# Patient Record
Sex: Female | Born: 1935 | Race: White | Hispanic: No | Smoking: Never smoker
Health system: Southern US, Community
[De-identification: ages and names within clinical notes are randomized; demographics above are authoritative.]

## PROBLEM LIST (undated history)

## (undated) DIAGNOSIS — I10 Essential (primary) hypertension: Secondary | ICD-10-CM

## (undated) DIAGNOSIS — M549 Dorsalgia, unspecified: Secondary | ICD-10-CM

## (undated) DIAGNOSIS — F329 Major depressive disorder, single episode, unspecified: Secondary | ICD-10-CM

## (undated) DIAGNOSIS — N200 Calculus of kidney: Secondary | ICD-10-CM

## (undated) DIAGNOSIS — F32A Depression, unspecified: Secondary | ICD-10-CM

## (undated) HISTORY — DX: Major depressive disorder, single episode, unspecified: F32.9

## (undated) HISTORY — DX: Depression, unspecified: F32.A

## (undated) HISTORY — DX: Calculus of kidney: N20.0

## (undated) HISTORY — DX: Essential (primary) hypertension: I10

## (undated) HISTORY — PX: KIDNEY STONE SURGERY: SHX686

## (undated) HISTORY — DX: Dorsalgia, unspecified: M54.9

## (undated) HISTORY — PX: ABDOMINAL HYSTERECTOMY: SHX81

---

## 1993-03-02 HISTORY — PX: BACK SURGERY: SHX140

## 2001-09-28 ENCOUNTER — Ambulatory Visit (HOSPITAL_COMMUNITY): Admission: RE | Admit: 2001-09-28 | Discharge: 2001-09-28 | Payer: Self-pay | Admitting: Internal Medicine

## 2001-09-28 ENCOUNTER — Encounter: Payer: Self-pay | Admitting: Internal Medicine

## 2001-09-30 ENCOUNTER — Ambulatory Visit (HOSPITAL_COMMUNITY): Admission: RE | Admit: 2001-09-30 | Discharge: 2001-09-30 | Payer: Self-pay | Admitting: Internal Medicine

## 2001-09-30 ENCOUNTER — Encounter: Payer: Self-pay | Admitting: Internal Medicine

## 2001-10-18 ENCOUNTER — Ambulatory Visit (HOSPITAL_COMMUNITY): Admission: RE | Admit: 2001-10-18 | Discharge: 2001-10-18 | Payer: Self-pay | Admitting: General Surgery

## 2003-01-01 ENCOUNTER — Emergency Department (HOSPITAL_COMMUNITY): Admission: EM | Admit: 2003-01-01 | Discharge: 2003-01-01 | Payer: Self-pay | Admitting: Emergency Medicine

## 2003-01-18 ENCOUNTER — Ambulatory Visit (HOSPITAL_COMMUNITY): Admission: RE | Admit: 2003-01-18 | Discharge: 2003-01-18 | Payer: Self-pay | Admitting: Family Medicine

## 2003-11-28 ENCOUNTER — Ambulatory Visit (HOSPITAL_COMMUNITY): Admission: RE | Admit: 2003-11-28 | Discharge: 2003-11-28 | Payer: Self-pay | Admitting: Unknown Physician Specialty

## 2005-06-16 ENCOUNTER — Ambulatory Visit (HOSPITAL_COMMUNITY): Admission: RE | Admit: 2005-06-16 | Discharge: 2005-06-16 | Payer: Self-pay | Admitting: Internal Medicine

## 2005-07-14 ENCOUNTER — Ambulatory Visit (HOSPITAL_COMMUNITY): Admission: RE | Admit: 2005-07-14 | Discharge: 2005-07-14 | Payer: Self-pay | Admitting: Internal Medicine

## 2007-05-24 ENCOUNTER — Emergency Department (HOSPITAL_COMMUNITY): Admission: EM | Admit: 2007-05-24 | Discharge: 2007-05-24 | Payer: Self-pay | Admitting: Emergency Medicine

## 2008-03-02 HISTORY — PX: CHOLECYSTECTOMY: SHX55

## 2008-10-12 ENCOUNTER — Ambulatory Visit (HOSPITAL_COMMUNITY): Admission: RE | Admit: 2008-10-12 | Discharge: 2008-10-12 | Payer: Self-pay | Admitting: Family Medicine

## 2008-10-19 ENCOUNTER — Inpatient Hospital Stay (HOSPITAL_COMMUNITY): Admission: EM | Admit: 2008-10-19 | Discharge: 2008-10-22 | Payer: Self-pay | Admitting: Emergency Medicine

## 2008-11-23 ENCOUNTER — Encounter (INDEPENDENT_AMBULATORY_CARE_PROVIDER_SITE_OTHER): Payer: Self-pay | Admitting: Surgery

## 2008-11-23 ENCOUNTER — Ambulatory Visit (HOSPITAL_COMMUNITY): Admission: RE | Admit: 2008-11-23 | Discharge: 2008-11-24 | Payer: Self-pay | Admitting: Surgery

## 2009-08-29 ENCOUNTER — Emergency Department (HOSPITAL_COMMUNITY): Admission: EM | Admit: 2009-08-29 | Discharge: 2009-08-29 | Payer: Self-pay | Admitting: Emergency Medicine

## 2009-09-24 ENCOUNTER — Ambulatory Visit (HOSPITAL_COMMUNITY): Admission: RE | Admit: 2009-09-24 | Discharge: 2009-09-24 | Payer: Self-pay | Admitting: Urology

## 2009-09-26 ENCOUNTER — Ambulatory Visit (HOSPITAL_COMMUNITY)
Admission: RE | Admit: 2009-09-26 | Discharge: 2009-09-26 | Payer: Self-pay | Source: Home / Self Care | Admitting: Urology

## 2009-11-13 ENCOUNTER — Ambulatory Visit (HOSPITAL_COMMUNITY): Admission: RE | Admit: 2009-11-13 | Discharge: 2009-11-13 | Payer: Self-pay | Admitting: Internal Medicine

## 2009-12-31 HISTORY — PX: COLONOSCOPY: SHX174

## 2009-12-31 HISTORY — PX: ESOPHAGOGASTRODUODENOSCOPY: SHX1529

## 2010-01-01 ENCOUNTER — Ambulatory Visit: Payer: Self-pay | Admitting: Internal Medicine

## 2010-01-01 DIAGNOSIS — R197 Diarrhea, unspecified: Secondary | ICD-10-CM

## 2010-01-02 ENCOUNTER — Encounter: Payer: Self-pay | Admitting: Internal Medicine

## 2010-01-04 ENCOUNTER — Encounter: Payer: Self-pay | Admitting: Gastroenterology

## 2010-01-07 ENCOUNTER — Encounter: Payer: Self-pay | Admitting: Gastroenterology

## 2010-01-08 ENCOUNTER — Encounter: Payer: Self-pay | Admitting: Gastroenterology

## 2010-01-08 LAB — CONVERTED CEMR LAB: TSH: 1.211 microintl units/mL (ref 0.350–4.500)

## 2010-01-09 ENCOUNTER — Encounter: Payer: Self-pay | Admitting: Internal Medicine

## 2010-01-10 ENCOUNTER — Encounter: Payer: Self-pay | Admitting: Internal Medicine

## 2010-01-17 ENCOUNTER — Telehealth (INDEPENDENT_AMBULATORY_CARE_PROVIDER_SITE_OTHER): Payer: Self-pay | Admitting: *Deleted

## 2010-01-27 ENCOUNTER — Telehealth (INDEPENDENT_AMBULATORY_CARE_PROVIDER_SITE_OTHER): Payer: Self-pay

## 2010-01-28 ENCOUNTER — Ambulatory Visit: Payer: Self-pay | Admitting: Internal Medicine

## 2010-01-28 ENCOUNTER — Ambulatory Visit (HOSPITAL_COMMUNITY): Admission: RE | Admit: 2010-01-28 | Discharge: 2010-01-28 | Payer: Self-pay | Admitting: Internal Medicine

## 2010-02-03 ENCOUNTER — Encounter (INDEPENDENT_AMBULATORY_CARE_PROVIDER_SITE_OTHER): Payer: Self-pay

## 2010-03-23 ENCOUNTER — Encounter: Payer: Self-pay | Admitting: Unknown Physician Specialty

## 2010-04-01 NOTE — Progress Notes (Signed)
Summary: Adding EGD to Colonoscopy  Phone Note Call from Patient Call back at Home Phone [phone]   Caller: Patient Call For: Gerrit Halls Reason for Call: Talk to Doctor Action Taken: Provider Notified Details of Complaint: Diffculty swallowing Summary of Call: Patient called and wanted to know if she could go ahead and have the upper endoscopy done since she is scheduled for a colonoscopy.She stated she is still having problems swallowing.Marland KitchenMarland KitchenMarland KitchenPlease advise? Initial call taken by: Ave Filter,  January 17, 2010 11:32 AM     Appended Document: Adding EGD to Colonoscopy that's fine. she had mentioned issues but had wanted to hold off. If she is having more problems, we can add.   Appended Document: Adding EGD to Colonoscopy Added EGD to procedure scheduled for 12/2909. I notified endo and the pt.

## 2010-04-01 NOTE — Progress Notes (Signed)
Summary: phone note/ ? med for back pain/ TCS tomorrow  Phone Note Call from Patient   Caller: Patient Summary of Call: Pt called and is having colonoscopy tomorrow. Has chronic back pain and doesn't have Tylenol at home.  Wants to know if it will be OK to take Ibuprofen or Aleve or ASA for the back pain today. Please advise!  Initial call taken by: Cloria Spring LPN,  January 27, 2010 11:11 AM     Appended Document: phone note/ ? med for back pain/ TCS tomorrow may take ibuprofen or aleve today. still needs to stick with the instructions on sheet regarding NPO after midnight, etc.   Appended Document: phone note/ ? med for back pain/ TCS tomorrow Addendum: Do not take ibuprofen or aleve. Take Tylenol products only. Thanks A  Appended Document: phone note/ ? med for back pain/ TCS tomorrow LMOM to call.  Appended Document: phone note/ ? med for back pain/ TCS tomorrow Pt informed of the above.

## 2010-04-01 NOTE — Letter (Signed)
Summary: OP REPORT  OP REPORT   Imported By: Rexene Alberts 01/10/2010 10:36:05  _____________________________________________________________________  External Attachment:    Type:   Image     Comment:   External Document

## 2010-04-01 NOTE — Letter (Signed)
Summary: REFERRAL FROM DR Phillips Odor  REFERRAL FROM DR Phillips Odor   Imported By: Rexene Alberts 01/02/2010 14:19:01  _____________________________________________________________________  External Attachment:    Type:   Image     Comment:   External Document

## 2010-04-01 NOTE — Letter (Signed)
Summary: LABS  LABS   Imported By: Rexene Alberts 01/02/2010 14:31:25  _____________________________________________________________________  External Attachment:    Type:   Image     Comment:   External Document

## 2010-04-01 NOTE — Letter (Signed)
Summary: AUTH FOR THE RELEASE OF MED INFO  AUTH FOR THE RELEASE OF MED INFO   Imported By: Rexene Alberts 01/07/2010 10:01:35  _____________________________________________________________________  External Attachment:    Type:   Image     Comment:   External Document

## 2010-04-01 NOTE — Assessment & Plan Note (Signed)
Summary: DIARRHEA/SS   Visit Type:  Initial Consult Referring Provider:  Phillips Odor Primary Care Provider:  Sherwood Gambler  CC:  diarrhea.  History of Present Illness: Jessica Bauer is a pleasant 77 year old female who presents today at the request of Dr. Phillips Odor due to chronic diarrhea. She states diarrhea began last year in 2010 when her gallbladder was removed. In May had bout of bronchitis and had several rounds of abx. Reports worsening of frequency, severity of diarrhea since that time.  Reports "pouring" water, difficult to make it to bathroom. Diarrhea seems to be worse in morning, but tapers off during day. In past occurred immediately after eating, now seems to occur regardless. Averages around 6 loose, watery stools per day; does have rare days that has a normal BM. Looks like mucus, no brbpr, hematochezia. Occasionally cramping prior to diarrhea, otherwise without abdominal pain. Denies nausea. Weight stable. No loss of appetite. Also reports +dysphagia over past few years, rare in occurrence. Feels like it may be related to anxiety. Last colonoscopy fall 2009 in [place], was reportedly incomplete secondary to low BP. No travel outside country, well water here, city water in [place], on abx currently for UTI. Recent studies done by Dr. Phillips Odor on 12/23/09: stool for O&P (-), stool culture (-), one C-diff (-), lactoferrin (-).   Current Medications (verified): 1)  Simvastatin 5 Mg Tabs (Simvastatin) .... Take 1 Tablet By Mouth Once A Day 2)  Atenolol 25 Mg Tabs (Atenolol) .... Take 1 Tablet By Mouth Once A Day 3)  Augmentin 875-125 Mg Tabs (Amoxicillin-Pot Clavulanate) .... Take 1 Tablet By Mouth Two Times A Day 4)  Lomotil 2.5-0.025 Mg Tabs (Diphenoxylate-Atropine) .... One Tablet Twice Daily 5)  Remeron 15 Mg Tabs (Mirtazapine) .... Take 1 Tablet By Mouth Once A Day 6)  Pentazocine-Acetaminophen 25-650 Mg Tabs (Pentazocine-Acetaminophen) .... As Needed 7)  Fosamax 70 Mg Tabs (Alendronate Sodium)  .... Once Weekly 8)  Joint Therapy .... One Tablet Three Times Daily 9)  Klor-Con 10 10 Meq Cr-Tabs (Potassium Chloride) .... Take 1 Tablet By Mouth Three Times A Day 10)  Glucosamine 500 Mg Caps (Glucosamine Sulfate) .... Three Capsules Daily 11)  Centrum Multi-Vitamin .... Take 1 Tablet By Mouth Once A Day  Allergies (verified): 1)  ! Bactrim 2)  ! Codeine  Past History:  Past Medical History: Hypertension Back pain Depression  Past Surgical History: Hysterectomy Back surgery 95 Cholecystectomy 2010 Kidney stone removal 2009, 2011  Family History: Mother:died at 54, CVA Father:died at 1, CVA Siblings:aneurysm No FH of Colon Cancer: Grandfather: some type of ca  Social History: Patient has never smoked.  Alcohol Use - no Daily Caffeine Use: coffee in mornings Patient gets regular exercise. [place] and [place]Smoking Status:  never Does Patient Exercise:  yes  Review of Systems General:  Denies fever, chills, and anorexia. Eyes:  Denies blurring, irritation, and discharge. ENT:  Denies earache, ear discharge, sore throat, and hoarseness. CV:  Denies chest pains, palpitations, and dyspnea on exertion. Resp:  Denies dyspnea at rest and dyspnea with exercise. GI:  Complains of abdominal pain, diarrhea, and change in bowel habits; denies difficulty swallowing, pain on swallowing, nausea, bloody BM's, and black BMs. GU:  Denies urinary burning and blood in urine. MS:  Denies joint pain / LOM, joint swelling, and joint stiffness. Derm:  Denies rash, itching, and dry skin. Neuro:  Denies weakness, paralysis, and headache. Psych:  Denies depression and anxiety.  Vital Signs:  Patient profile:   77 year  old female Height:      61 inches Weight:      126 pounds BMI:     23.89 Temp:     98.1 degrees F oral Pulse rate:   72 / minute BP sitting:   130 / 88  (left arm) Cuff size:   regular  Physical Exam  General:  Well developed, well nourished,  no acute distress. Head:  Normocephalic and atraumatic. Eyes:  no scleral icterus Mouth:  No deformity or lesions, dentition normal. Neck:  Supple; no masses or thyromegaly. Lungs:  Clear throughout to auscultation. Heart:  Regular rate and rhythm; no murmurs, rubs,  or bruits. Abdomen:  normal bowel sounds, without guarding, without rebound, no distension, no masses, and no hepatomegally or splenomegaly.   Msk:  Symmetrical with no gross deformities. Normal posture. Pulses:  Normal pulses noted. Extremities:  No clubbing, cyanosis, edema or deformities noted. Neurologic:  Alert and  oriented x4;  grossly normal neurologically. Skin:  Intact without significant lesions or rashes. Psych:  Alert and cooperative. Normal mood and affect.  Impression & Recommendations:  Problem # 1:  DIARRHEA (ICD-787.91) Jessica Bauer is a pleasant 77 year old female with chronic diarrhea for the past year, which she states started around the time after her cholecystectomy. Reports watery, occasionally post-prandial diarrhea. Intermittent days of normal BM, but usually diarrhea daily. Difficulty with incontinence/urgency. Occasional lower abdominal cramping relieved after stool. Has had several rounds of abx in the past year, most recently in May secondary to bronchitis; diarrhea worsened since this time. stool cultures negative, C diff X 1 negative. No other labs. Last colonoscopy in 2009 in [place]. No report available at this time. Diff Dx: C diff, IBS, bile salt diarrhea, microscopic colitis, doubt celiac disease  Obtain colonoscopy reports from [facility] in 2009 Obtain 2 more C-diff stool samples Giardia TSH Probiotic called into [facility] in [place] per pt request. Consider flex-sig/TCS if stool studies negative.   Orders: T-TSH [phone]) T-Stool Giardia / Crypto- EIA (71062) T-Culture, C-Diff Toxin A/B ([id number]) T-Culture, C-Diff Toxin A/B ([id number]) Consultation Level IV  (99371) Prescriptions: ALIGN  CAPS (PROBIOTIC PRODUCT) take 1 by mouth daily  #30 x 3   Entered and Authorized by:   Gerrit Halls NP   Signed by:   Gerrit Halls NP on 01/01/2010   Method used:   Faxed to ...       [facility] ([facility]       [address]       [place], [place]  [address]       Ph: [phone]       Fax: [phone]   RxID:   [id number]   Appended Document: DIARRHEA/SS 01/18/2008. Colonoscopy done in [place]: Grade I internal hemorrhoids   Appended Document: DIARRHEA/SS Please set up for TCS with Dr. Jena Gauss, change in bowel habits. Thanks!  Appended Document: DIARRHEA/SS Pt scheduled for tcs 01/28/10@10 :30am..Marland KitchenPt aware of appt. instructions faxed to [facility] in [place].

## 2010-04-01 NOTE — Miscellaneous (Signed)
Summary: stool studies from aph  Clinical Lists Changes Culture, Stool(Salm/Shig/Campy&ECO157) - STATUS: Final  SEE NOTE.                                 Perform Date: 29Nov11 11:34  Ordered By: Jena Gauss MD , Gerrit Friends           Ordered Date: 16XWR60 11:33  Facility: [facility]                               Department: [facility]  Service Report Text     SPECIMEN OBTAINED:            01/28/2010 11:34  SPECIMEN DESCRIPTION:         STOOL                                ENDO SPECIMEN  SPECIAL REQUESTS:             NONE  CULTURE:                      NO SALMONELLA, SHIGELLA, CAMPYLOBACTER, OR                                YERSINIA ISOLATED                                Performed at [facility]  REPORT STATUS:                FINAL                                02/01/2010  Additional Information  HL7 RESULT STATUS : F  External IF Update Timestamp : 2010-02-01:17:04:00.000000    Ova and Parasite Exam - STATUS: Final  SEE NOTE.                                 Perform Date: 29Nov11 11:34  Ordered By: Jena Gauss MD , Gerrit Friends           Ordered Date: [id number] 11:33  Facility: [facility]                               Department: [facility]  Service Report Text     SPECIMEN OBTAINED:            01/28/2010 11:34  SPECIMEN DESCRIPTION:         STOOL                                ENDO SPECIMEN  SPECIAL REQUESTS:             NONE  OVA AND PARASITES:            NO OVA OR PARASITES SEEN  REPORT STATUS:                FINAL  01/29/2010  Additional Information  HL7 RESULT STATUS : F  External IF Update Timestamp : 2010-01-29:15:58:00.000000    L-Clostridium Difficile by PCR - STATUS: Final                                            Perform Date: 29Nov11 11:34  Ordered By: Jena Gauss MD , Gerrit Friends           Ordered Date: 29Nov11 12:48                                       Last Updated Date: 16XWR60 11:29  Facility: [facility]                               Department: [facility]  Accession #:  [id number] CDPCR                  USN:       [id number]  Findings  Result Name                              Result     Abnl   Normal Range     Units      Perf. Loc.  C Difficile by PCR                       SEE NOTE.         NDETEC    Oversized comment, see footnote  1  Footnotes  1. Not Detected     (NOTE)     This assay detects the presence of Clostridium difficile DNA coding     for toxin B (tcdB) by real-time polymerase chain reaction (PCR)     amplification.     This test was developed and its performance characteristics have been     determined by [facility]. Performance characteristics refer     to the analytical performance of the test. This test has not been     cleared or approved by the [place] Food and Drug Administration. The FDA     has determined that such clearance or approval is not necessary. This     laboratory is certified under the Clinical Laboratory Improvement     Amendments of 1988 as qualified to perform high complexity clinical     laboratory testing.     Performed at [facility]  Additional Information  HL7 RESULT STATUS : F  External IF Update Timestamp : 2010-01-29:11:27:00.000000    Fecal-Lactoferrin - STATUS: Final  SEE NOTE.                                 Perform Date: 29Nov11 11:34  Ordered By: Jena Gauss MD , Gerrit Friends           Ordered Date: [id number] 11:34  Facility: [facility]                               Department: [facility]  Service Report Text     SPECIMEN OBTAINED:  01/28/2010 11:34  SPECIMEN DESCRIPTION:         STOOL                                ENDO SPECIMEN  SPECIAL REQUESTS:             NONE  FECAL LACTOFERRIN:            POSITIVE  REPORT STATUS:                FINAL                                01/29/2010  Additional Information  HL7 RESULT STATUS : F  External IF Update Timestamp : 2010-01-29:06:43:00.000000  Appended Document: stool studies from aph stool all negative except for pos  lactoferrin; should be on a probiotic - indefinitely -offer f/u appt if needed  Appended Document: stool studies from aph tried to call pt- LMOM  Appended Document: stool studies from aph pt aware

## 2010-04-01 NOTE — Letter (Signed)
Summary: TCS ORDER  TCS ORDER   Imported By: Ave Filter 01/09/2010 11:01:47  _____________________________________________________________________  External Attachment:    Type:   Image     Comment:   External Document

## 2010-05-13 LAB — CLOSTRIDIUM DIFFICILE BY PCR: Toxigenic C. Difficile by PCR: NOT DETECTED

## 2010-05-13 LAB — FECAL LACTOFERRIN, QUANT

## 2010-05-13 LAB — STOOL CULTURE

## 2010-05-13 LAB — OVA AND PARASITE EXAMINATION: Ova and parasites: NONE SEEN

## 2010-05-17 LAB — BASIC METABOLIC PANEL
CO2: 29 mEq/L (ref 19–32)
Calcium: 9 mg/dL (ref 8.4–10.5)
Glucose, Bld: 99 mg/dL (ref 70–99)
Sodium: 142 mEq/L (ref 135–145)

## 2010-05-17 LAB — CBC
HCT: 37.9 % (ref 36.0–46.0)
Hemoglobin: 13.2 g/dL (ref 12.0–15.0)
MCH: 34.7 pg — ABNORMAL HIGH (ref 26.0–34.0)
MCHC: 34.9 g/dL (ref 30.0–36.0)
MCV: 99.3 fL (ref 78.0–100.0)

## 2010-05-17 LAB — SURGICAL PCR SCREEN: Staphylococcus aureus: NEGATIVE

## 2010-05-18 LAB — DIFFERENTIAL
Basophils Relative: 0 % (ref 0–1)
Lymphs Abs: 1 10*3/uL (ref 0.7–4.0)
Monocytes Relative: 4 % (ref 3–12)
Neutro Abs: 8.6 10*3/uL — ABNORMAL HIGH (ref 1.7–7.7)
Neutrophils Relative %: 85 % — ABNORMAL HIGH (ref 43–77)

## 2010-05-18 LAB — URINALYSIS, ROUTINE W REFLEX MICROSCOPIC
Bilirubin Urine: NEGATIVE
Glucose, UA: NEGATIVE mg/dL
Specific Gravity, Urine: 1.025 (ref 1.005–1.030)
pH: 5.5 (ref 5.0–8.0)

## 2010-05-18 LAB — URINE MICROSCOPIC-ADD ON

## 2010-05-18 LAB — CBC
Hemoglobin: 13.4 g/dL (ref 12.0–15.0)
MCHC: 34 g/dL (ref 30.0–36.0)
Platelets: 193 10*3/uL (ref 150–400)
RBC: 3.89 MIL/uL (ref 3.87–5.11)

## 2010-05-18 LAB — BASIC METABOLIC PANEL
Calcium: 8.9 mg/dL (ref 8.4–10.5)
GFR calc Af Amer: >60 mL/min (ref >60)
GFR calc non Af Amer: 51 mL/min — ABNORMAL LOW (ref >60)
Sodium: 137 mEq/L (ref 135–145)

## 2010-06-06 LAB — DIFFERENTIAL
Basophils Absolute: 0 10*3/uL (ref 0.0–0.1)
Eosinophils Relative: 1 % (ref 0–5)
Lymphocytes Relative: 33 % (ref 12–46)
Lymphs Abs: 2.2 10*3/uL (ref 0.7–4.0)
Monocytes Absolute: 0.6 10*3/uL (ref 0.1–1.0)
Monocytes Relative: 9 % (ref 3–12)
Neutro Abs: 3.8 10*3/uL (ref 1.7–7.7)

## 2010-06-06 LAB — BASIC METABOLIC PANEL
GFR calc Af Amer: >60 mL/min (ref >60)
GFR calc non Af Amer: >60 mL/min (ref >60)
Glucose, Bld: 101 mg/dL — ABNORMAL HIGH (ref 70–99)
Potassium: 4.6 mEq/L (ref 3.5–5.1)
Sodium: 138 mEq/L (ref 135–145)

## 2010-06-06 LAB — CBC
HCT: 40.2 % (ref 36.0–46.0)
Hemoglobin: 13.5 g/dL (ref 12.0–15.0)
RBC: 4.05 MIL/uL (ref 3.87–5.11)
RDW: 13 % (ref 11.5–15.5)

## 2010-06-07 LAB — COMPREHENSIVE METABOLIC PANEL
ALT: 12 U/L (ref 0–35)
ALT: 13 U/L (ref 0–35)
AST: 15 U/L (ref 0–37)
Albumin: 2.8 g/dL — ABNORMAL LOW (ref 3.5–5.2)
Alkaline Phosphatase: 51 U/L (ref 39–117)
Alkaline Phosphatase: 51 U/L (ref 39–117)
CO2: 29 mEq/L (ref 19–32)
Calcium: 8.6 mg/dL (ref 8.4–10.5)
Chloride: 105 mEq/L (ref 96–112)
Chloride: 106 mEq/L (ref 96–112)
GFR calc Af Amer: >60 mL/min (ref >60)
GFR calc non Af Amer: >60 mL/min (ref >60)
Glucose, Bld: 83 mg/dL (ref 70–99)
Potassium: 4.3 mEq/L (ref 3.5–5.1)
Potassium: 4.9 mEq/L (ref 3.5–5.1)
Sodium: 139 mEq/L (ref 135–145)
Sodium: 139 mEq/L (ref 135–145)
Total Bilirubin: 0.3 mg/dL (ref 0.3–1.2)
Total Bilirubin: 0.4 mg/dL (ref 0.3–1.2)
Total Protein: 5.8 g/dL — ABNORMAL LOW (ref 6.0–8.3)

## 2010-06-07 LAB — DIFFERENTIAL
Basophils Absolute: 0 10*3/uL (ref 0.0–0.1)
Basophils Absolute: 0 10*3/uL (ref 0.0–0.1)
Basophils Absolute: 0 10*3/uL (ref 0.0–0.1)
Basophils Relative: 0 % (ref 0–1)
Basophils Relative: 0 % (ref 0–1)
Basophils Relative: 1 % (ref 0–1)
Basophils Relative: 1 % (ref 0–1)
Eosinophils Absolute: 0.1 10*3/uL (ref 0.0–0.7)
Eosinophils Absolute: 0.1 10*3/uL (ref 0.0–0.7)
Eosinophils Relative: 2 % (ref 0–5)
Lymphocytes Relative: 39 % (ref 12–46)
Lymphocytes Relative: 48 % — ABNORMAL HIGH (ref 12–46)
Lymphs Abs: 1.8 10*3/uL (ref 0.7–4.0)
Lymphs Abs: 2.6 10*3/uL (ref 0.7–4.0)
Monocytes Absolute: 0.5 10*3/uL (ref 0.1–1.0)
Monocytes Absolute: 0.6 10*3/uL (ref 0.1–1.0)
Monocytes Absolute: 0.6 10*3/uL (ref 0.1–1.0)
Monocytes Relative: 11 % (ref 3–12)
Monocytes Relative: 9 % (ref 3–12)
Neutro Abs: 2.3 10*3/uL (ref 1.7–7.7)
Neutro Abs: 2.7 10*3/uL (ref 1.7–7.7)
Neutro Abs: 2.8 10*3/uL (ref 1.7–7.7)
Neutrophils Relative %: 40 % — ABNORMAL LOW (ref 43–77)
Neutrophils Relative %: 48 % (ref 43–77)
Neutrophils Relative %: 51 % (ref 43–77)
Neutrophils Relative %: 52 % (ref 43–77)

## 2010-06-07 LAB — CBC
HCT: 37.7 % (ref 36.0–46.0)
HCT: 38.3 % (ref 36.0–46.0)
Hemoglobin: 12.9 g/dL (ref 12.0–15.0)
Hemoglobin: 13.4 g/dL (ref 12.0–15.0)
Hemoglobin: 13.4 g/dL (ref 12.0–15.0)
MCHC: 34.4 g/dL (ref 30.0–36.0)
MCHC: 34.8 g/dL (ref 30.0–36.0)
MCV: 99.5 fL (ref 78.0–100.0)
Platelets: 186 10*3/uL (ref 150–400)
Platelets: 187 10*3/uL (ref 150–400)
Platelets: 194 10*3/uL (ref 150–400)
Platelets: 197 10*3/uL (ref 150–400)
Platelets: 218 10*3/uL (ref 150–400)
RBC: 4.02 MIL/uL (ref 3.87–5.11)
RDW: 12.9 % (ref 11.5–15.5)
RDW: 12.9 % (ref 11.5–15.5)
RDW: 12.9 % (ref 11.5–15.5)
WBC: 5.2 10*3/uL (ref 4.0–10.5)
WBC: 6.6 10*3/uL (ref 4.0–10.5)
WBC: 6.7 10*3/uL (ref 4.0–10.5)

## 2010-06-07 LAB — HEPATIC FUNCTION PANEL
Bilirubin, Direct: 0.1 mg/dL (ref 0.0–0.3)
Indirect Bilirubin: 0.4 mg/dL (ref 0.3–0.9)
Total Protein: 6 g/dL (ref 6.0–8.3)

## 2010-06-07 LAB — BASIC METABOLIC PANEL
BUN: 12 mg/dL (ref 6–23)
BUN: 12 mg/dL (ref 6–23)
BUN: 14 mg/dL (ref 6–23)
CO2: 30 mEq/L (ref 19–32)
Calcium: 8.8 mg/dL (ref 8.4–10.5)
Calcium: 8.9 mg/dL (ref 8.4–10.5)
Chloride: 106 mEq/L (ref 96–112)
Creatinine, Ser: 0.73 mg/dL (ref 0.4–1.2)
Creatinine, Ser: 0.75 mg/dL (ref 0.4–1.2)
GFR calc Af Amer: >60 mL/min (ref >60)
GFR calc Af Amer: >60 mL/min (ref >60)
GFR calc non Af Amer: >60 mL/min (ref >60)
Glucose, Bld: 131 mg/dL — ABNORMAL HIGH (ref 70–99)
Potassium: 4.1 mEq/L (ref 3.5–5.1)

## 2010-06-07 LAB — LIPID PANEL
LDL Cholesterol: 92 mg/dL (ref 0–99)
Triglycerides: 69 mg/dL (ref <150)
VLDL: 14 mg/dL (ref 0–40)

## 2010-06-07 LAB — CARDIAC PANEL(CRET KIN+CKTOT+MB+TROPI)
CK, MB: 1.8 ng/mL (ref 0.3–4.0)
CK, MB: 2.1 ng/mL (ref 0.3–4.0)
Relative Index: INVALID (ref 0.0–2.5)
Total CK: 47 U/L (ref 7–177)
Total CK: 53 U/L (ref 7–177)
Troponin I: 0.03 ng/mL (ref 0.00–0.06)

## 2010-06-07 LAB — BRAIN NATRIURETIC PEPTIDE: Pro B Natriuretic peptide (BNP): <30 pg/mL (ref 0.0–100.0)

## 2010-06-07 LAB — URINE CULTURE
Colony Count: NO GROWTH
Culture: NO GROWTH

## 2010-06-07 LAB — URINALYSIS, ROUTINE W REFLEX MICROSCOPIC
Bilirubin Urine: NEGATIVE
Glucose, UA: NEGATIVE mg/dL
Hgb urine dipstick: NEGATIVE
Ketones, ur: NEGATIVE mg/dL
Specific Gravity, Urine: 1.01 (ref 1.005–1.030)
pH: 6 (ref 5.0–8.0)

## 2010-06-07 LAB — POCT CARDIAC MARKERS
CKMB, poc: <1 ng/mL — ABNORMAL LOW (ref 1.0–8.0)
Myoglobin, poc: 45 ng/mL (ref 12–200)
Troponin i, poc: <0.05 ng/mL (ref 0.00–0.09)

## 2010-06-07 LAB — D-DIMER, QUANTITATIVE: D-Dimer, Quant: 0.33 ug/mL-FEU (ref 0.00–0.48)

## 2010-06-07 LAB — TSH: TSH: 3.245 u[IU]/mL (ref 0.350–4.500)

## 2010-07-15 NOTE — Group Therapy Note (Signed)
NAME:  Jessica Bauer, KAUS             ACCOUNT NO.:  [id number]   MEDICAL RECORD NO.:  [id number]          PATIENT TYPE:  OBV   LOCATION:  A334                          FACILITY:  [facility]   PHYSICIAN:  Melissa L. Ladona Ridgel, MD  DATE OF BIRTH:  12-07-1925   DATE OF PROCEDURE:  10/21/2008  DATE OF DISCHARGE:                                 PROGRESS NOTE   Subjectively, again the patient feels well today.  She has had  completely no pain whatsoever.  We await evaluation in the a.m. for  possible discharge for surgical evaluation as an outpatient.  She denies  nausea or vomiting.  She is eating well.   VITAL SIGNS:  Temperature 98.3, blood pressure 114/70, pulse 59,  respirations 20, saturation 96%.  Generally a moderately nourished white female in no acute distress.  She is normocephalic, atraumatic.  Pupils appear to be slightly  irregular, but reactive.  Mucous membranes are moist.  NECK:  Supple.  There is no JVD.  No lymph nodes.  No carotid bruits.  Chest is decreased but clear to auscultation.  There are no rhonchi,  rales or wheezes.  CARDIOVASCULAR:  Regular rhythm.  Positive S1, S2.  No S3, S4.  No  murmurs, rubs or gallops.  ABDOMEN:  Soft, nontender, nondistended with positive bowel sounds.  No  hepatosplenomegaly, no guarding or rebound.  EXTREMITIES:  Show no clubbing, cyanosis, or edema.  Neurologically she is awake, alert, oriented.  Cranial nerves II-XII are  intact.  Power is 5/5.  DTRs 2+.  Plantars downgoing.   PERTINENT LABORATORIES:  Today reveal a sodium of 141, potassium 3.8,  chloride 108, CO2 30, BUN is 12, creatinine 0.7 and glucose of 131.  White count is 5.6, hemoglobin 12.9, hematocrit 37.5 and platelets of  186.  As stated the urine culture from the 19th is not growing anything  and I am not sure what the significance of that is.  We may be able to  get away with discontinuing therapy and not moving on to linezolid.  I  will check with infectious disease and see  what they think and in the  morning.   1. Urinary tract infection.  Will check with infectious disease and      decide whether to continue therapy or not.  2. Possible cholecystitis.  Will continue oral antibiotics as an      outpatient and coordinate care with surgery for possible outpatient      procedure.  3. Chest pain.  We will coordinate outpatient stress testing for      preoperative clearance at the time of discharge.  4. Anxiety.  The patient will continue with her Elavil and Xanax.  5. Hypertension.  Continue with her Tenormin.   Total time on this case was 20 minutes.      Melissa L. Ladona Ridgel, MD  Electronically Signed     MLT/MEDQ  D:  10/21/2008  T:  10/21/2008  Job:  [id number]

## 2010-07-15 NOTE — Group Therapy Note (Signed)
NAME:  Jessica Bauer, Jessica Bauer             ACCOUNT NO.:  [id number]   MEDICAL RECORD NO.:  [id number]          PATIENT TYPE:  OBV   LOCATION:  A334                          FACILITY:  [facility]   PHYSICIAN:  Melissa L. Ladona Ridgel, MD  DATE OF BIRTH:  Feb 15, 1926   DATE OF PROCEDURE:  10/20/2008  DATE OF DISCHARGE:                                 PROGRESS NOTE   SUBJECTIVE:  The patient is feeling much better.  She states that she  has had no pain in her abdomen today. She denies nausea or vomiting.  She has been up and ambulating, washing herself, and getting around.   PHYSICAL EXAMINATION:  VITAL SIGNS:  Current temperature was 98.3, blood  pressure 119/72, pulse 65, respirations 16, saturation 95%.  GENERAL:  This is a moderately nourished elderly female in no acute  distress.  HEENT:  She is normocephalic, atraumatic.  Pupils equal, round, and  reactive to light.  Extraocular muscles intact.  She has anicteric  sclerae.  Examination of nose reveals septum midline.  Examination of  mouth reveals reasonable dentition.  No oral lesions or lip lesions.  NECK:  Supple.  There is no JVD.  No lymph nodes.  No carotid bruits.  CHEST:  Decreased but clear to auscultation.  There are no rhonchi,  rales or wheezes.  CARDIOVASCULAR:  Regular rate and rhythm.  Positive S1, S2.  No S3, S4.  No murmurs, rubs or gallops.  ABDOMEN:  Soft, nontender, nondistended with positive bowel sounds.  No  hepatosplenomegaly, no guarding or rebound.  Extremities have been  EXTREMITIES:  Thin but reasonable. There is no edema and no lesions.  PSYCHIATRIC:  Affect is appropriate.  Recent and remote memory intact.  Judgment and insight intact.   PERTINENT LABORATORIES:  White count is 6.6, hemoglobin 13.4, hematocrit  38.3 and platelets of 197.  Her TSH was 3.245.  Her urine culture from  08/19 is actually showing no growth.   ASSESSMENT/PLAN:  This is a very pleasant 77 year old female that was  admitted to the hospital  after being found to have an methicillin-  resistant Staphylococcus aureus urinary tract infection in the  outpatient office.  The patient also had come to the hospital more  because she complained of chest pain.  Upon examining and evaluating her  complaints, it appears that she is actually having right upper quadrant  pain.  Ultrasound confirmed that there were stones and some thickening  in the gallbladder. Her antibiotic coverage was, therefore, broadened to  cover gallbladder. She has been on Cipro, Flagyl and vancomycin.  The  patient is doing much better.  She has had no further pains. She has a  little bit of pink in her cheeks, and she does definitely have improved  sense of being.   1. Methicillin-resistant Staphylococcus aureus urinary tract      infection.  Will continue vancomycin for now. I will decide in the      morning whether not we switch her to linezolid as it appears that      her last urine culture is not growing any MRSA.  2. Gallbladder.  Thickening with possible cholecystitis.  We are      treating her with broad-spectrum antibiotics.  The patient desires      to have her surgery as an outpatient and will likely need to be      cleared from a cardiac perspective prior to that because of the      chest pain. On Monday, we will coordinate care to assist with that      process.  3. Chest pain, resolved.  This may be all related to gallbladder, but      we do need to continue with an outpatient workup.   Total time on this patient was 20 minutes.      Melissa L. Ladona Ridgel, MD  Electronically Signed     MLT/MEDQ  D:  10/21/2008  T:  10/21/2008  Job:  [id number]

## 2010-07-15 NOTE — H&P (Signed)
NAME:  Jessica Bauer             ACCOUNT NO.:  [id number]   MEDICAL RECORD NO.:  [id number]          PATIENT TYPE:  OBV   LOCATION:  A334                          FACILITY:  [facility]   PHYSICIAN:  Melissa L. Ladona Ridgel, MD  DATE OF BIRTH:  May 12, 1925   DATE OF ADMISSION:  10/18/2008  DATE OF DISCHARGE:  LH                              HISTORY & PHYSICAL   CC: Left sided Chest  pain.   HISTORY OF PRESENT ILLNESS:  [name] is an 77 year old Caucasian  female who presents to the [facility] Emergency Department complaining  of left-sided chest pain that radiates slightly up her neck and down her  left arm.  At the time that she presents to [facility], she is pain  free.  She tells me that she has had this pain on and off for weeks, but  in the last two days it is gotten more severe, to the point where she is  unable to sleep at night.  It is worse when she lays down at night to go  to bed.  It does not seem to be associated with food.  It is noteworthy  that she had a CT of her abdomen and pelvis for a chronic urinary tract  infection just six days ago on August 13.  She had no obstructing  urinary tract calculus or nephrolithiasis.  She did have small hepatic  hypodense lesions.  Her gallbladder was mildly distended, and she had  calcified gallstones.   The patient tells me that she has had chronic back pain for years and  had surgery for this many years ago.  She has been on the etodolac  b.i.d. times multiple years.  She has not had any PPI therapy with this.  She does have occasional complaints of reflux and has also become  somewhat anorexic recently.  She describes no melena, hematochezia, or  vomiting.   She has had a persistent UTI that was treated with Septra for 10 days.  However, her hematuria persisted her primary care Bray Vickerman put her on  Levaquin.  She is now on her fourth day of Levaquin and wonders if the  Levaquin is causing her chest pain.   She tells me that she  had a colonoscopy one year ago in [place] that  was normal, without colonic polyps, and she has never had an upper  endoscopy.   PAST MEDICAL HISTORY:  Significant for hypertension,  hypercholesterolemia, kidney stone, depression, and anxiety.   PREVIOUS SURGERIES:  Include hysterectomy, back surgery, and kidney  stone removal.   CURRENT MEDICATIONS:  Include Levaquin 500 mg 1 tablet daily, started on  August 13, atenolol 25 mg once daily, Zocor 20 mg once daily, Xanax 0.25  mg b.i.d., amitriptyline 50 mg q.h.s., and etodolac 500 mg b.i.d.   ALLERGIES:  She has allergies to Bactrim and codeine.   REVIEW OF SYSTEMS:  CONSTITUTIONAL:  The patient has had no fever or  chills.  She has had weight loss over the last several years, centered  around the death of her husband.  She is somewhat anorexic now.  She is  not easily fatigued, and she complains of no rash, itch or changes.  HEENT:  She has an occasional stabbing headache in the left parietal  area.  She has no changes in vision.  No pain in her ears, nose, mouth,  or throat.  CHEST/LUNGS:  She has no pain with breathing.  She does have  a small nonproductive chronic cough.  She has no shortness of breath.  She does have occasional heart palpitations.  GASTROINTESTINAL:  She has  a decrease in her appetite.  No abdominal pain.  No change in her bowel  movement.  No melena, hematochezia, vomiting, or dysphagia.  GENITOURINARY:  She has no discharge or pain with urination.  She has  had blood in urine in her urine.  However, it has recently cleared  MUSCULOSKELETAL SYSTEM:  She has no joint pain, stiffness, swelling, or  redness in her joints.  NEUROLOGICAL: She has no problems with balance,  syncope, or seizures.  PSYCHIATRIC:  She has had some problems in the  past with depression, mostly related to the death of her husband.  She  complains of no mood changes or suicidal thoughts.   SOCIAL HISTORY:  She is a Optician, dispensing.  Does not  smoke tobacco, drink  alcohol, or use any recreational drugs.   FAMILY HISTORY:  Positive for hypertension and stroke.  Negative for  known gallbladder disease or coronary artery disease.   PHYSICAL EXAMINATION:  VITAL SIGNS:  On physical exam her vital signs  are as follows.  Temperature 98.3, pulse 71, respirations 20.  Blood  pressure is 141/74.  GENERAL APPEARANCE:  The patient is a very pleasant 77 year old thin  Caucasian female who is lying comfortably in the ED department in no  apparent distress.  HEENT:  Her head is normocephalic, atraumatic.  Eyes are somewhat  sunken.  However, they are anicteric with pupils that are equal, round,  and reactive to light.  Her conjunctivae are pink.  Her nose shows no  nasal discharge or exterior lesions.  Her mouth shows no exterior  lesions.  She has moist mucous membranes and good dentition.  NECK:  Her neck shows no obvious JVD or lymphadenopathy.  Trachea is  midline.  Neck is supple.  RESPIRATORY:  Chest is thin.  She has no accessory muscle use.  No  wheezes or crackles to my auscultation.  CARDIOVASCULAR:  Heart has a regular rate and rhythm with no obvious  murmurs, rubs, or gallops.  ABDOMEN:  Soft, nontender, nondistended with good bowel sounds.  RECTAL:  On rectal exam, she is guaiac negative.  EXTREMITIES:  Her lower extremities show no edema.  They are slightly  cool to the touch but show no cyanosis.  She has 1+ dorsalis pedis  pulses bilaterally.  NEURO:  She is fully alert and oriented.  Has 5/5 strength in all four  extremities.  There is no evidence of any facial asymmetry.  Reflexes  are 2+ bilaterally.   LABS:  A white count of 5.2, hemoglobin 13.8, hematocrit 40, platelets  218,000.  D dimer is 0.33.  Sodium is 141.  Potassium is 4.5.  Chloride  106, bicarb 30, glucose 96, BUN 14, creatinine 0.73, total bilirubin  0.5, alk phos 59, AST 16, ALT 12, total protein 6.0, serum albumin 3.1.  Cardiac enzymes are negative  times one.  Urine shows 3 to 6 white blood  cells in each high-powered field.  She has a small amount of leukocytes.  RADIOLOGICAL EXAMS:  Include a chest x-ray from today that shows COPD  with no acute abnormalities.  The CT abdomen and pelvis done August 13  showed a mildly distended gallbladder with calcified gallstones as  mentioned above.  It is also noteworthy that the patient had an  echocardiogram in 2007 for a mitral valve click.  She had mild left  atrial enlargement, mild LVH, mild mitral and tricuspid regurgitation,  marked right atrial enlargement, moderate right ventricular enlargement  with preserved right systolic function.   ASSESSMENT:  Dr. Derenda Mis has seen and examined the patient,  collected her history, and reviewed her chart, and her impression is  this is an 77 year old Caucasian female who comes to the emergency  department with chest pain that has been becoming progressively severe  over the last couple of weeks.  She also has a persistent urinary tract  infection, hypertension, chronic obstructive pulmonary disease, and  anxiety.  We will admit her for observation, rule out cardiac sources  for her chest pain.  Also, we will consider gastrointestinal sources for  her chest pain.  We will start her on antibiotic therapy for her urinary  tract infection and a proton pump inhibitor for possible peptic ulcer  disease or gastritis.  We will discontinue nonsteroidal anti-  inflammatory drug use.  More recommendations to follow as we are further  along in her evaluation.   Addendum: I had seen and examined its patient. I have reviewed the past  medical history, a surgical history, social history and family history,  as well as review system, allergies and medications. I've participated  in developing a plan of care for this patient with the physician's  assistant and agree with the above.   Melissa L. Ladona Ridgel, M.D.      Stephani Police, PA       Melissa L. Ladona Ridgel, MD  Electronically Signed    MLY/MEDQ  D:  10/18/2008  T:  10/18/2008  Job:  [id number]   cc:   [facility]

## 2010-07-15 NOTE — Discharge Summary (Signed)
NAME:  Jessica Bauer, Jessica Bauer             ACCOUNT NO.:  [id number]   MEDICAL RECORD NO.:  [id number]          PATIENT TYPE:  OBV   LOCATION:  A334                          FACILITY:  [facility]   PHYSICIAN:  Melissa L. Ladona Ridgel, MD  DATE OF BIRTH:  01/31/1926   DATE OF ADMISSION:  10/18/2008  DATE OF DISCHARGE:  08/23/2010LH                               DISCHARGE SUMMARY   DISCHARGE DIAGNOSES INCLUDE:  1. Noncardiac chest pain - presumed gallbladder disease.  2. Urinary tract infection - Methicillin-resistant Staphylococcus      aureus.  3. Hypertension.   CONSULTANTS:  Dr. Mariah Milling from [facility] saw the  patient on August 19.  His impression was that this is an 77 year old  female who had three sets of negative cardiac enzymes.  It is very  likely her chest pain was not cardiac related.  He suggested that an  outpatient cardiac stress test could be performed.  If this was  negative, her symptoms could be managed medically, and if it was  positive, they would need with her in the office and go over the results  with her.   PERTINENT RADIOLOGICAL EXAMS:  [name] had an ultrasound of  her abdomen done on August 20 with the following results:  Cholelithiasis with gallbladder wall thickening, cannot exclude early  acute cholecystitis; extensive atherosclerotic disease of the abdominal  aorta; mild pancreatic ductal prominence without definite pancreatic  mass or calcification.  Her common bile duct was 6 mm in diameter.   HISTORY AND BRIEF HOSPITAL COURSE:  [name] is an 77-year-  old female originally from [place] down here for a short-term stay who  presented to the [facility] ED with chest pain on August 19th.  She also  was being treated for a MRSA UTI as an outpatient.  With regards to the  chest pain, she described the sensation as being over her left breast.  It radiated up her neck and down her left arm at times.  She had had the  sensation  on and off for weeks.  However, it had become more intense  just prior to her presentation at the hospital.  She was unable to sleep  the night before.  She noticed that the pain usually became acute when  she laid down to go to sleep at night.  It was not necessarily  associated with food.  She had no dyspnea on exertion.  She was seen by  Dr. Mariah Milling for the chest pain.  His results are noted above.  An  ultrasound of her upper abdomen was done.  The results are noted above.  She was placed on Flagyl for empiric treatment of early cholecystitis.  It was also noted that the patient had been taking etodolac for years  for back pain and felt that she could possibly have NSAID-induced  gastritis.  She was taken off her etodolac.  Protonix daily was started.  She was given Ultram for her pain.  She did not want to have further  evaluation of her gallbladder in [place] and would prefer it to be  done in [place].  Consequently, an outpatient office visit was  scheduled with Dr. Corliss Skains for potential gallbladder surgery.  Also, an  outpatient cardiac stress test is scheduled in Dr. Windell Hummingbird office.  If  this is negative, he will  evaluate her for cardiac clearance for  gallbladder surgery.   With regards to her UTI, approximately two weeks ago the patient was  diagnosed with a UTI because she was having hematuria.  She was placed  on Septra for 10 days.  At the end of this prescription treatment, she  was still having hematuria, returned to her primary care Ryonna Cimini who  then placed her on Levaquin as she had had a urine culture that showed  MRSA sensitive to Levaquin.  When she presented to the [facility] ED for  chest pain, she had been on the Levaquin for four days.  As an inpatient  she received three days of IV vancomycin.  Cultures were taken when she  presented to the [facility] ED, and these urine cultures did not grow  MRSA or any other bacteria.  This morning prior to discharge I  have  spoken with Dr. Orvan Falconer with regards to the need for antibiotic  treatment for MRSA UTI.  Given the antibiotic treatment that she has had  to date as well as the fact that urine taken at the [facility] ED prior  to giving her IV vancomycin did not grow anything, Dr. Orvan Falconer  recommended that we consider the UTI treated and not continue outpatient  antibiotic therapy for UTI.  Note, the patient has not had hematuria for  approximately seven days.   PHYSICAL EXAMINATION:  VITAL SIGNS:  On physical exam today, the  patient's vital signs are as follows:  Temperature 98.1, pulse 61,  respirations 20.  Blood pressure is 125/62.  GENERAL APPEARANCE:  She is alert, oriented in no apparent distress,  lying comfortably in her hospital bed.  HEENT:  Her head is atraumatic, normocephalic.  Her eyes are anicteric  with pupils that are equal, round, and reactive to light.  Conjunctivae  are pink.  She has no nasal discharge.  She has no exterior lesions on  her nose or mouth.  Her mucous membranes are moist.  NECK:  Her neck is supple.  Trachea midline.  She demonstrates no JVD or  lymphadenopathy.  CHEST:  Her chest has no accessory muscle use.  RESPIRATORY:  Her lungs clear to auscultation bilaterally with no  wheezes or crackles.  CARDIOVASCULAR:  Her heart has regular rate and rhythm with no murmurs,  rubs, or gallops.  Normal S1, S2.  EXTREMITIES:  Her extremities show no cyanosis or edema.  She has 2+  radial pulses bilaterally and 2+ dorsalis pedis pulses bilaterally.  ABDOMEN:  Her abdomen is soft, nontender, nondistended with good bowel  sounds.  PSYCHIATRICALLY:  Both her short-term and long-term memory are intact.  She is in good humor, does not appear anxious today.  Looking forward to  going home and packing up her house to go back to [place].  She has no  signs of depression or suicidal ideation.   LABS:  Labs today show a CBC with a white count of 6.7, hemoglobin 12.9,   hematocrit 37.7, platelets 187,000.  On August 21 her LFTs were as  follows:  Total bilirubin 0.3, alkaline phosphatase 51, AST 17, ALT 13.  Serum albumin 2.8.  Bmet today:  Sodium 139, potassium 4.1, chloride  105, bicarb 29, glucose 87,  BUN 12, creatinine 0.77.  Cardiac markers  taken on August 19 and August 20 were negative x3.  Fasting lipid  studies show a cholesterol of 151, triglycerides of 69, high-density  cholesterol 45, low-density cholesterol 92, very low density 14.  Thyroid studies show a TSH of 3.245.   DISPOSITION:  The patient will be discharged to home in good condition.   FOLLOW-UP INCLUDES:  1. An appointment at [facility] with Dr. Corliss Skains on August      26 at 10:45 a.m. to plan for potential gallbladder surgery.  2. She will have an outpatient cardiac stress test that [facility] on August 30 at 8:15 a.m.  3. Finally she has an office visit scheduled with Dr. Mariah Milling at 9:45      on September 9 to review her stress test results and grant cardiac      clearance hopefully after she is seen by Dr. Corliss Skains  and has her      cardiac stress test.  Her appointment with Dr. Mariah Milling or      [facility] can be moved up to allow her      surgery to happen earlier as she wants to go back to [place]      a[place]s.a.p[place]   DISCHARGE MEDICATIONS:  Discharge medications are as follows:  1. Xanax 0.25 one tablet twice a day as needed for anxiety.  2. Elavil 25 mg one tablet at bedtime.  3. Atenolol 25 mg one tablet daily.  4. Zocor 20 mg one tablet daily.  5. Tramadol 50 mg one tablet every 4 hours as needed for pain.  6. Protonix one daily for 1 month.  7. Flagyl 500 mg twice daily for seven days for treatment of early      cholecystitis   Approximately 50 minutes was spent on this discharge.   Addendum: Please note that I saw and examined this patient prior to  discharge.  I agree with the above discharge diagnoses. I would add  the  following  discharge diagnoses: anxiety, able. tant and agree withs case  with a physician's assistant and agree with the above discharge plan.   Melissa L. Ladona Ridgel, M.D.      Stephani Police, PA      Melissa L. Ladona Ridgel, MD  Electronically Signed    MLY/MEDQ  D:  10/22/2008  T:  10/22/2008  Job:  [id number]   cc:   Wilmon Arms. Corliss Skains, M.D.  [address] [place] [address]  [place]   Antonieta Iba, MD  Fax: [phone]

## 2010-07-15 NOTE — Discharge Summary (Signed)
NAME:  Jessica Bauer, Jessica Bauer             ACCOUNT NO.:  [id number]   MEDICAL RECORD NO.:  [id number]          PATIENT TYPE:  OBV   LOCATION:  A334                          FACILITY:  [facility]   PHYSICIAN:  Melissa L. Ladona Ridgel, MD  DATE OF BIRTH:  October 10, 1925   DATE OF ADMISSION:  10/18/2008  DATE OF DISCHARGE:                               DISCHARGE SUMMARY   ADDENDUM:  The addendum is as follows:  1. Please add to discharge medications Cipro 500 mg 1 tablet p.o.      b.i.d. for 14 days.  2. Please add under cc's cc to Melony Overly, physician extender, at      [facility].      Stephani Police, [place]      Melissa L. Ladona Ridgel, MD  Electronically Signed    MLY/MEDQ  D:  10/22/2008  T:  10/22/2008  Job:  [id number]   cc:   Madelin Rear. Sherwood Gambler, MD  Fax: [phone]

## 2010-07-15 NOTE — Consult Note (Signed)
NAME:  Jessica, Bauer NO.:  [id number]   MEDICAL RECORD NO.:  [id number]          PATIENT TYPE:  OBV   LOCATION:  A334                          FACILITY:  [facility]   PHYSICIAN:  Antonieta Iba, MD   DATE OF BIRTH:  Nov 26, 1925   DATE OF CONSULTATION:  10/19/2008  DATE OF DISCHARGE:                                 CONSULTATION   IDENTIFICATION:  Jessica Bauer is a very pleasant 77 year old woman who  looks younger than her stated age, who has a past medical history of  hypertension and hyperlipidemia, kidney stones, depression and anxiety,  who presented to [facility] on October 18, 2008, with chest  pain.   Jessica Bauer states that she developed chest pain, radiating over the  left breast, up into the neck and into the shoulders.  She has had short  episodes of this discomfort before though it was more severe and she was  unable to sleep that night prior to presentation.  She states that she  has had it on and off for several weeks.  No significant association  with food or with exertion and it seemed to come on sometimes at rest.  It seemed to resolve one its own when she arrived to the hospital, and  she has not really had any significant chest pain since that time.   She has had negative cardiac enzymes to date, and has been on  antibiotics during her inpatient stay.   REVIEW OF SYSTEMS:  Review of systems is notable for very for chest pain  on the left side as detailed above, some stress and anxiety.  No  lightheadedness, dizziness, no significant shortness of breath or edema.  No significant weight loss recently, otherwise review of systems is  negative.   PAST MEDICAL HISTORY:  1. Past medical history is notable for hypertension.  2. Hyperlipidemia.  3. Kidney stones.  4. Depression.  5. Anxiety.  6. Gallstones, as documented recently on a CT scan.   SOCIAL HISTORY:  She has no smoking history.  Her husband was a  Optician, dispensing.  She lives alone  and does not drink and no drugs.   FAMILY HISTORY:  Notable for stroke and hypertension.  Not significant  for premature coronary disease.   PHYSICAL EXAMINATION:  GENERAL:  She is sitting comfortably in bed and  again appears younger than her stated age.  VITAL SIGNS:  She is afebrile, heart rate in the 60s, blood pressure 120  to 130 systolic over 60, with respirations of 18 to 20, oxygen  saturation 97% on room air.  HEENT:  Exam is benign.  Oropharynx is clear.  NECK:  Supple with no JVP or carotid bruits.  HEART:  Heart sounds are regular with S1 and S2, and a 1 to 2/6 systolic  ejection murmur heard at the right sternal border, radiating to the left  sternal border.  LUNGS:  Clear to auscultation with no wheezes or rales.  ABDOMEN:  Benign.  Has no significant lower extremity edema.  NEUROLOGIC:  Neurologic exam is grossly nonfocal.  SKIN:  Skin is warm and  dry.   LABORATORY DATA:  Cardiac enzymes negative x3, with one more set  pending.  CMP is within normal limits with a creatinine of 0.67, BMP  less than 30, a UA showing no growth and urine culture pending, 3 to 6  white blood cell count in the urine.  D-dimer also negative at 0.33.   EKG showing normal sinus rhythm with poor R-wave progression anteriorly,  with no other significant ST-T wave changes.  Borderline left axis  deviation.   IMPRESSION:  In summary, Jessica Bauer is an 77 year old woman who is  typically very active, with a history of some anxiety, gallstones and  kidney stones, with recent episodes of chest pain over the past several  weeks, worse prior to admission.  She has had no significant chest pain  since she has been in the hospital, no electrocardiogram changes and  enzymes have been negative.  She has few risk factors, apart from her  age and a family history of stroke (though no cardiac history).   RECOMMENDATIONS:  We have suggested to Jessica Bauer that if her  third/fourth set of cardiac enzymes  are negative, she could go home from  a cardiac perspective, and that we could perform an outpatient stress  test.  We will contact her as soon as she gets home.  (Will contact her  on Monday), and schedule a pharmacologic stress test.  If this is  negative, then her symptoms can be managed medically.  If her stress  test is positive, we will meet with her in the office to go over the  results with her.   Thank you for this consult.  Please do not hesitate to contact me if I  can be of further assistance with the care of Jessica Bauer.      Antonieta Iba, MD  Electronically Signed     TJG/MEDQ  D:  10/19/2008  T:  10/19/2008  Job:  [id number]

## 2010-07-18 NOTE — H&P (Signed)
   NAME:  Jessica Bauer, Jessica Bauer NO.:  [id number]   MEDICAL RECORD NO.:  [id number]                   PATIENT TYPE:   LOCATION:                                       FACILITY:   PHYSICIAN:  Dalia Heading, M.D.               DATE OF BIRTH:  1925-06-05   DATE OF ADMISSION:  DATE OF DISCHARGE:                                HISTORY & PHYSICAL   CHIEF COMPLAINT:  Change in caliber of stools, hematochezia.   HISTORY OF PRESENT ILLNESS:  The patient is a 77 year old white female who  is referred for a colonoscopy.  She had an episode of blood per rectum  several weeks ago.  She has also noticed a decrease in the caliber of her  stools recently.  She does have occasional lower abdominal pain.  She also  describes some nonspecific rectal pain.  She denies any lightheadedness,  weight loss, fever, diarrhea, or melena.  She has never had a colonoscopy.  She denies any hemorrhoidal problems.  There is no immediate family history  of colon carcinoma.   PAST MEDICAL HISTORY:  Past medical history include hypertension, anxiety  disorder.   PAST SURGICAL HISTORY:  Hysterectomy, back surgery, cyst removal from the  urethra.   CURRENT MEDICATIONS:  Xanax, Premarin, amitriptyline, possibly Actonel.   ALLERGIES:  BACTRIM and CODEINE.   REVIEW OF SYSTEMS:  Unremarkable.   PHYSICAL EXAMINATION:  GENERAL:  On physical examination, the patient is a  well-developed, well-nourished white female in no acute distress.  VITAL SIGNS:  She is afebrile and vital signs are stable.  LUNGS:  Lungs are clear to auscultation with equal breath sounds  bilaterally.  HEART:  Examination reveals a regular rate and rhythm without S3, S4, or  murmurs.  ABDOMEN:  The abdomen is soft, nontender, and nondistended.  No  hepatosplenomegaly or masses are noted.  RECTAL:  Examination was deferred to the procedure.   IMPRESSION:  Change in caliber of stools, hematochezia, lower abdominal  pain.    PLAN:  The patient is scheduled for a colonoscopy on October 18, 2001.  The  risks and benefits of the procedure including bleeding and perforation were  fully explained to the patient, who gave informed consent.                                               Dalia Heading, M.D.    MAJ/MEDQ  D:  10/11/2001  T:  10/11/2001  Job:  [id number]   cc:   Madelin Rear. Sherwood Gambler, M.D.  [id number]  [place]  [place] [address]  Fax: [phone]

## 2010-07-18 NOTE — Procedures (Signed)
NAME:  Jessica Bauer, Jessica Bauer             ACCOUNT NO.:  [id number]   MEDICAL RECORD NO.:  [id number]          PATIENT TYPE:  OUT   LOCATION:  RAD                           FACILITY:  [facility]   PHYSICIAN:  Dani Gobble, MD       DATE OF BIRTH:  03-28-1925   DATE OF PROCEDURE:  06/17/2005  DATE OF DISCHARGE:                                  ECHOCARDIOGRAM   INDICATIONS:  77 year old female with the mitral valve click.   The technical quality of the study is adequate.  The aorta measures normal  at 3.1 cm.  The left atrium is mildly dilated.  The intraventricular septum  and posterior wall are mildly thickened.  The aortic valve is trileaflet and  pliable with normal leaflet excursion.  No significant aortic insufficiency  is noted.  Doppler interrogation showed aortic valve is within normal  limits.   The mitral valve appears grossly structurally normal.  No mitral valve  prolapse is noted.  Mild mitral regurgitation is noted.  Doppler  interrogation of the mitral valve is within normal limits.   The pulmonic valve is incompletely visualized but appeared be grossly  structurally normal.   Tricuspid valve also appears grossly structurally normal with mild tricuspid  regurgitation noted.   The left ventricle is a somewhat small cavity size but the LV IDD measured 3  cm and LV IC measured 2 cm.  Overall left ventricular  systolic function is  normal and no regional wall motion abnormalities are noted.  The presence of  diastolic dysfunction is inferred from pulse wave Doppler across mitral  valve.   The right ventricle is moderately dilated with normal right systolic  function.  The right atrium is markedly dilated.   IMPRESSION:  1.  Mild left atrial enlargement.  2.  Mild concentric LVH.  3.  Mild mitral and tricuspid regurgitation.  4.  Small left ventricular cavity size with normal left systolic function.      No regional wall motion abnormalities noted.  5.  Presence of diastolic  dysfunction is inferred from pulse wave Doppler      across the mitral valve.  6.  Moderate right ventricular enlargement with preserved right systolic      function.  7.  Marked right atrial enlargement.           ______________________________  Dani Gobble, MD     AB/MEDQ  D:  06/17/2005  T:  06/17/2005  Job:  [id number]   cc:   Madelin Rear. Sherwood Gambler, MD  Fax: [phone]

## 2010-09-24 IMAGING — CT CT ABDOMEN W/O CM
2 of 4 series · 16 of 46 positions shown, 18 images · non-contrast
Comparison: None.

CT ABDOMEN

CLINICAL DATA: Hematuria, history of renal calculi, urinary tract
infection

CT ABDOMEN AND PELVIS WITHOUT CONTRAST
TECHNIQUE: Multidetector CT imaging of the abdomen and pelvis was
performed following the standard protocol without intravenous
contrast.

[Series 2: standard/full over (age)lbs 5.0 · axial · 0.66mm/px · z∈[-426,-111]mm · 13 of 70 slices shown, 15 images]
[im 4/70  soft-tissue]
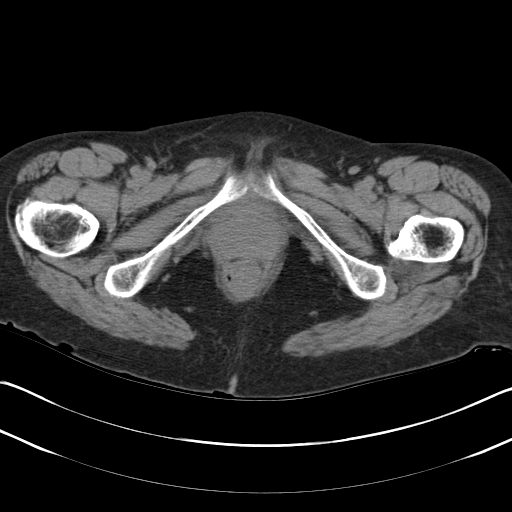
[im 4/70  bone]
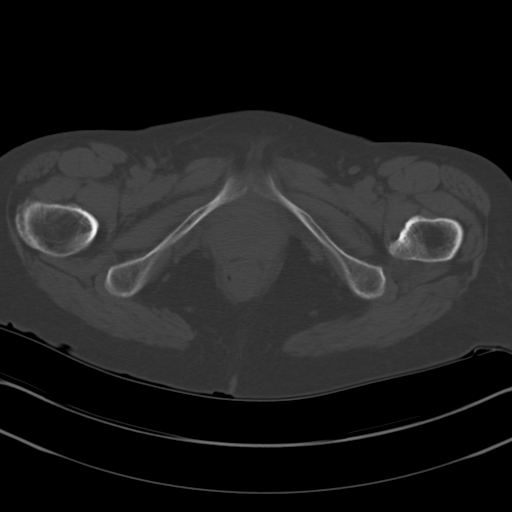
[im 10/70  soft-tissue]
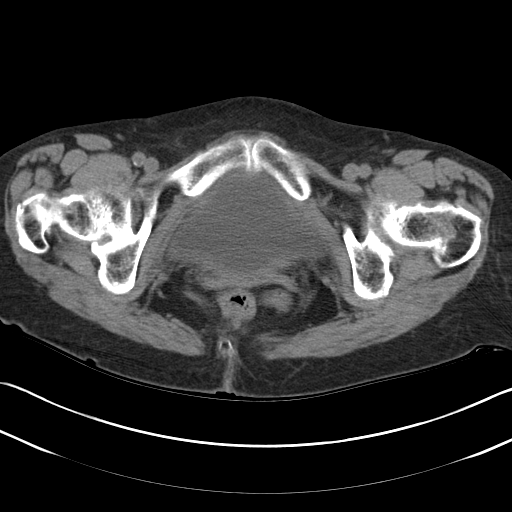
[im 16/70  soft-tissue]
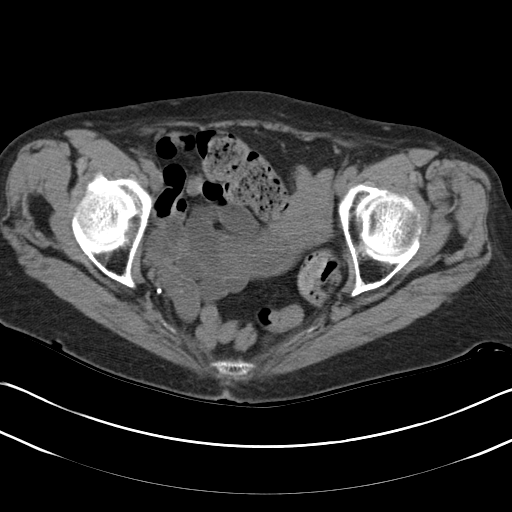
[im 19/70  soft-tissue]
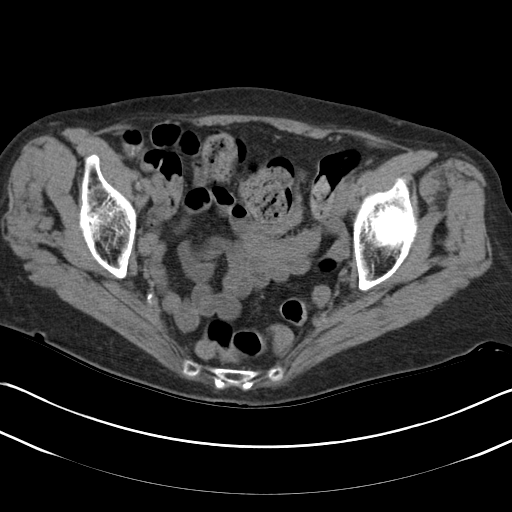
[im 25/70  soft-tissue]
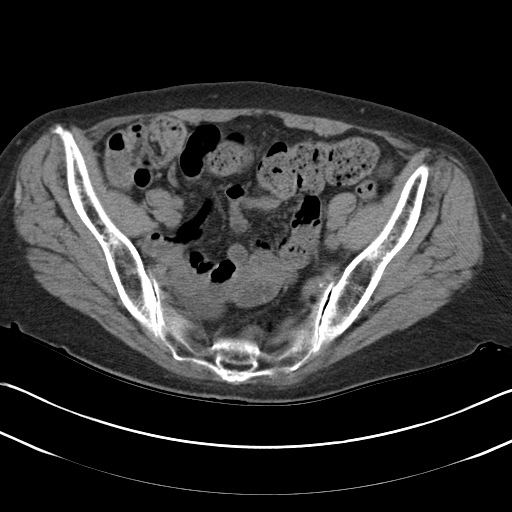
[im 31/70  soft-tissue]
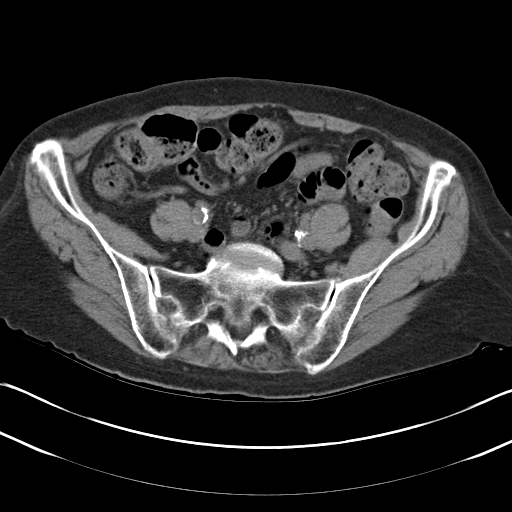
[im 37/70  soft-tissue]
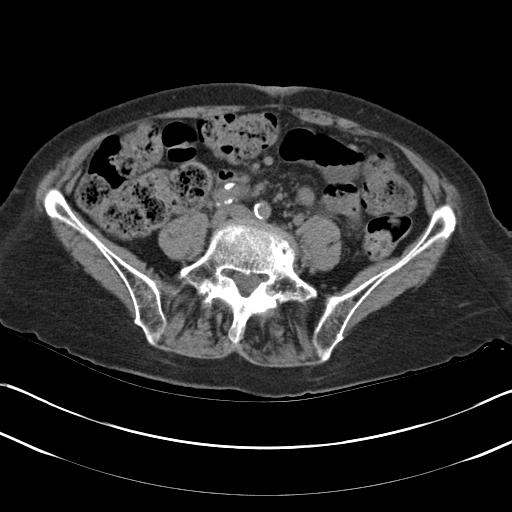
[im 40/70  soft-tissue]
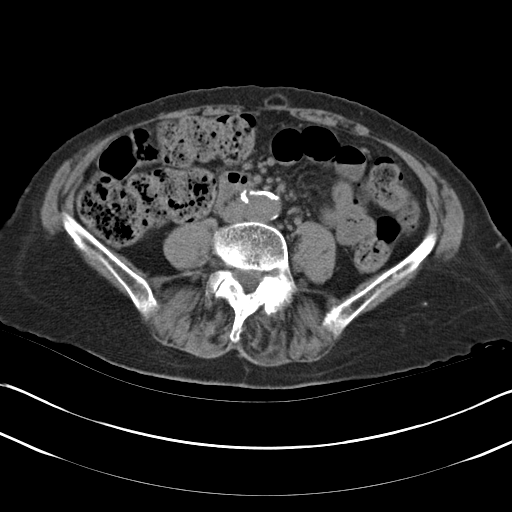
[im 46/70  soft-tissue]
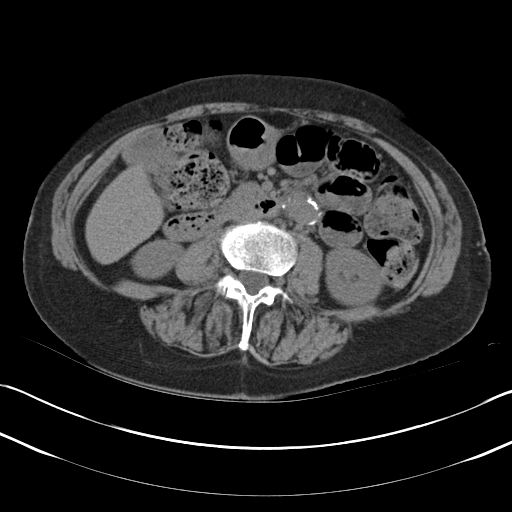
[im 46/70  bone]
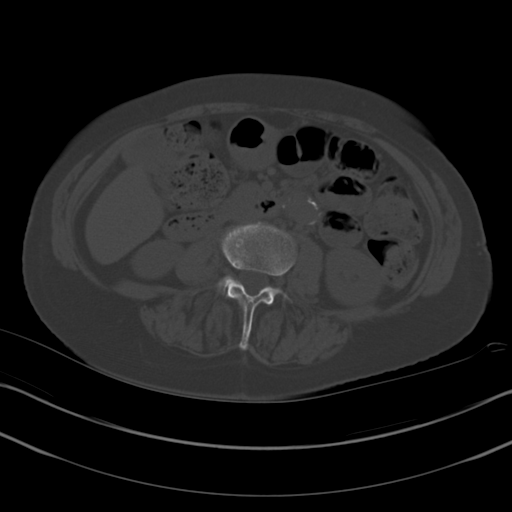
[im 52/70  soft-tissue]
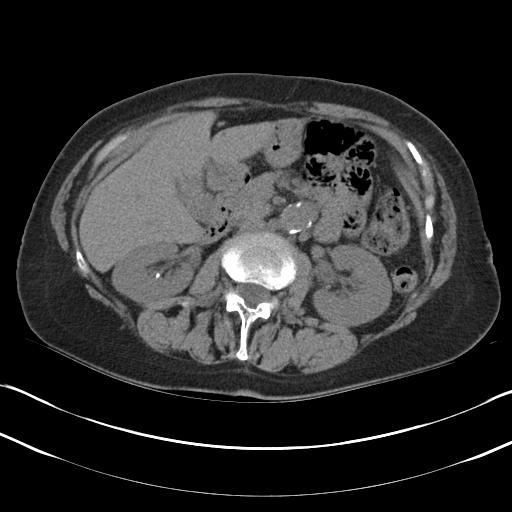
[im 55/70  soft-tissue]
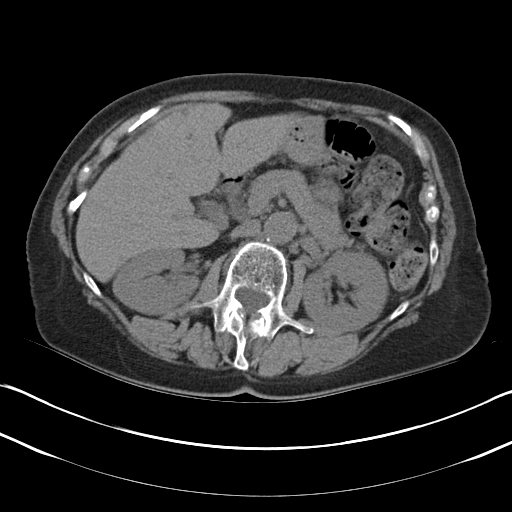
[im 61/70  soft-tissue]
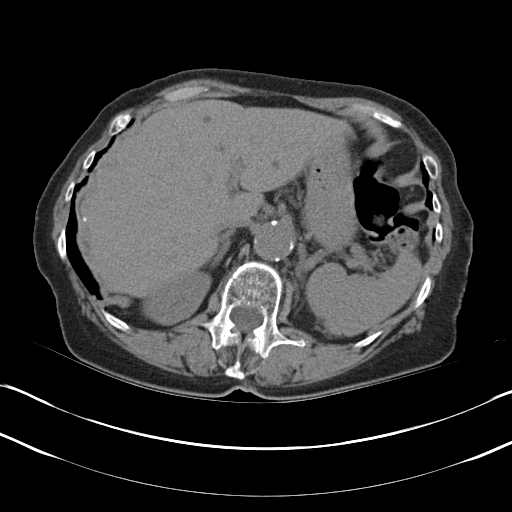
[im 67/70  soft-tissue]
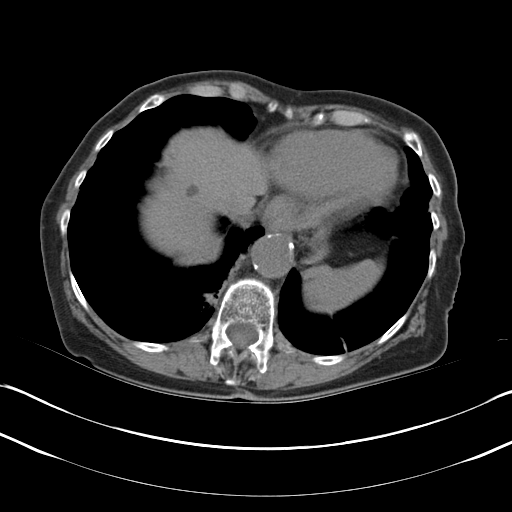

[Series 4: mpr coronal · coronal · 0.69mm/px · 3 of 63 slices shown]
[im 21/63  soft-tissue]
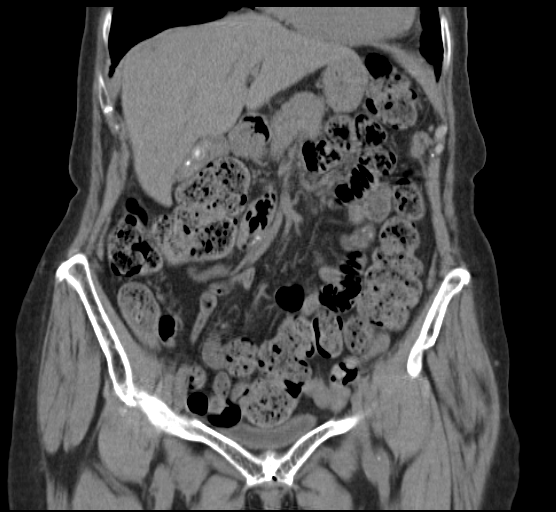
[im 28/63  soft-tissue]
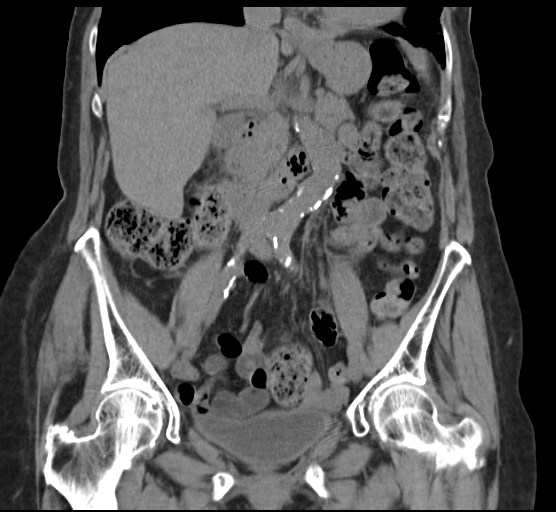
[im 35/63  soft-tissue]
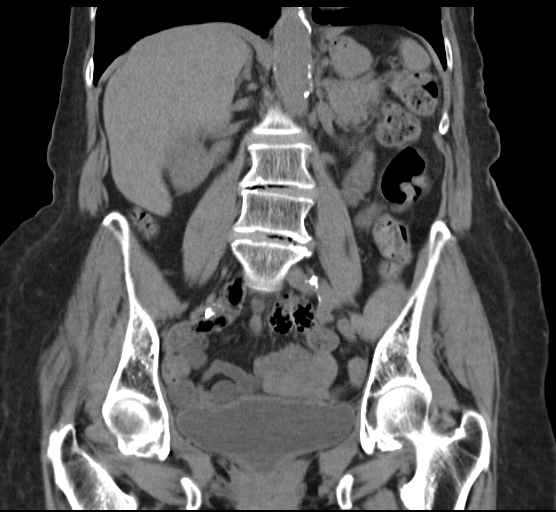

[16 of 46 positions shown; findings below may reference images not displayed]

FINDINGS: Minimal bibasilar atelectasis versus scarring. No
pleural or pericardial effusion.  Normal heart size.

Liver demonstrates scattered hypodense small cystic areas in the
liver.  These are 12 mm or less in size.  Suspect small hepatic
cysts.  No biliary dilatation or obstruction.  Small hepatic
calcified granuloma, image 21 in the right hepatic lobe
posteriorly.  Gallbladder is mildly distended with calcified
gallstones.  Adrenal glands, spleen, and pancreas are normal
appearance by noncontrast imaging.

The kidneys demonstrate no acute obstructive uropathy,
hydronephrosis or perinephric inflammation.  Right kidney has a
nonobstructing calyceal calcification in the mid pole, image 19.
Left kidney has a lower pole nonobstructing calculus 4 mm in size
in the lower pole, image 24.  Ureters are decompressed and
nondilated.  No periureteral inflammation or distention.
Nonspecific bowel gas pattern.  Retained stool throughout the
colon.  No abdominal free fluid, fluid collection, inflammation,
abscess, or free air.
IMPRESSION: No acute obstructing urinary tract calculus.
Nonobstructing bilateral nephrolithiasis
Small hepatic hypodense lesions, suspect small cysts
Calcified gallstones

CT PELVIS
FINDINGS: No distal dilated ureter or ureteral calculus.  No UVJ
abnormality or obstruction.  Previous hysterectomy noted.
Atherosclerosis iliac vessels.  No distal bowel acute process,
pelvic free fluid, fluid collection, abscess, hemorrhage,
adenopathy, or inguinal hernia.
IMPRESSION: No distal urinary tract calculus.
Postop changes
No acute intrapelvic finding.

## 2010-11-06 ENCOUNTER — Other Ambulatory Visit (HOSPITAL_COMMUNITY): Payer: Self-pay | Admitting: *Deleted

## 2010-11-06 DIAGNOSIS — Z139 Encounter for screening, unspecified: Secondary | ICD-10-CM

## 2010-11-18 ENCOUNTER — Ambulatory Visit (HOSPITAL_COMMUNITY): Payer: Self-pay

## 2010-11-28 ENCOUNTER — Ambulatory Visit (HOSPITAL_COMMUNITY)
Admission: RE | Admit: 2010-11-28 | Discharge: 2010-11-28 | Disposition: A | Discharge status: Home / Self Care | Payer: Medicare HMO | Source: Ambulatory Visit | Attending: Internal Medicine | Admitting: Internal Medicine

## 2010-11-28 DIAGNOSIS — Z139 Encounter for screening, unspecified: Secondary | ICD-10-CM

## 2010-11-28 DIAGNOSIS — Z1231 Encounter for screening mammogram for malignant neoplasm of breast: Secondary | ICD-10-CM | POA: Insufficient documentation

## 2010-12-08 ENCOUNTER — Other Ambulatory Visit (HOSPITAL_COMMUNITY): Payer: Self-pay | Admitting: Urology

## 2010-12-11 ENCOUNTER — Ambulatory Visit (HOSPITAL_COMMUNITY)
Admission: RE | Admit: 2010-12-11 | Discharge: 2010-12-11 | Disposition: A | Discharge status: Home / Self Care | Payer: Medicare PPO | Source: Ambulatory Visit | Attending: Urology | Admitting: Urology

## 2010-12-11 DIAGNOSIS — N200 Calculus of kidney: Secondary | ICD-10-CM | POA: Insufficient documentation

## 2010-12-11 DIAGNOSIS — N3020 Other chronic cystitis without hematuria: Secondary | ICD-10-CM | POA: Insufficient documentation

## 2011-12-17 ENCOUNTER — Other Ambulatory Visit (HOSPITAL_COMMUNITY): Payer: Self-pay | Admitting: Internal Medicine

## 2011-12-17 DIAGNOSIS — Z139 Encounter for screening, unspecified: Secondary | ICD-10-CM

## 2011-12-22 ENCOUNTER — Ambulatory Visit (HOSPITAL_COMMUNITY)
Admission: RE | Admit: 2011-12-22 | Discharge: 2011-12-22 | Disposition: A | Discharge status: Home / Self Care | Payer: Medicare PPO | Source: Ambulatory Visit | Attending: Internal Medicine | Admitting: Internal Medicine

## 2011-12-22 DIAGNOSIS — Z139 Encounter for screening, unspecified: Secondary | ICD-10-CM

## 2011-12-22 DIAGNOSIS — Z1231 Encounter for screening mammogram for malignant neoplasm of breast: Secondary | ICD-10-CM | POA: Insufficient documentation

## 2012-05-31 HISTORY — PX: ESOPHAGOGASTRODUODENOSCOPY: SHX1529

## 2013-06-05 ENCOUNTER — Other Ambulatory Visit (HOSPITAL_COMMUNITY): Payer: Self-pay | Admitting: Family Medicine

## 2013-06-05 DIAGNOSIS — Z01419 Encounter for gynecological examination (general) (routine) without abnormal findings: Secondary | ICD-10-CM

## 2013-06-15 ENCOUNTER — Other Ambulatory Visit (HOSPITAL_COMMUNITY): Payer: Medicare PPO

## 2013-06-15 ENCOUNTER — Ambulatory Visit (HOSPITAL_COMMUNITY): Payer: Medicare PPO

## 2013-07-03 ENCOUNTER — Encounter: Payer: Self-pay | Admitting: Internal Medicine

## 2013-07-07 ENCOUNTER — Other Ambulatory Visit (HOSPITAL_COMMUNITY): Payer: Medicare PPO

## 2013-07-07 ENCOUNTER — Ambulatory Visit (HOSPITAL_COMMUNITY): Payer: Medicare PPO

## 2013-07-13 ENCOUNTER — Encounter: Payer: Self-pay | Admitting: Gastroenterology

## 2013-07-13 ENCOUNTER — Ambulatory Visit (INDEPENDENT_AMBULATORY_CARE_PROVIDER_SITE_OTHER): Payer: Medicare PPO | Admitting: Gastroenterology

## 2013-07-13 VITALS — BP 142/78 | HR 57 | Temp 97.3°F | Ht 61.0 in | Wt 129.0 lb

## 2013-07-13 DIAGNOSIS — R1319 Other dysphagia: Secondary | ICD-10-CM | POA: Insufficient documentation

## 2013-07-13 DIAGNOSIS — R1314 Dysphagia, pharyngoesophageal phase: Secondary | ICD-10-CM

## 2013-07-13 DIAGNOSIS — K529 Noninfective gastroenteritis and colitis, unspecified: Secondary | ICD-10-CM

## 2013-07-13 DIAGNOSIS — R197 Diarrhea, unspecified: Secondary | ICD-10-CM

## 2013-07-13 DIAGNOSIS — R1310 Dysphagia, unspecified: Secondary | ICD-10-CM

## 2013-07-13 DIAGNOSIS — G4700 Insomnia, unspecified: Secondary | ICD-10-CM

## 2013-07-13 MED ORDER — RAMELTEON 8 MG PO TABS: 8.0000 mg | ORAL_TABLET | Freq: Every day | ORAL | Status: AC

## 2013-07-13 MED ORDER — CHOLESTYRAMINE 4 G PO PACK: PACK | ORAL | Status: AC

## 2013-07-13 NOTE — Progress Notes (Signed)
Primary Care Physician:  Cassell SmilesFUSCO,LAWRENCE J., MD  Primary Gastroenterologist:  Roetta SessionsMichael Rourk, MD   Chief Complaint  Patient presents with  . Diarrhea    HPI:  Jessica SanderBessie L Bauer is a 80 y.o. female here for further evaluation of diarrhea. Last seen in 2011 for chronic diarrhea and dysphagia. EGD/TCS as outlined below. She has continued to have diarrhea since 2011. Symptoms intermittent. Really began after gb out in 2010. Continued swallowing issues.  Doesn't matter what eats or don't eat. Tried to cut back on dairy milk. Consumes soy milk. Cannot remember when last solid stool. Episodes of fecal incontinenece.  Has copious stools like a laxative.  Diarrhea happens weekly. Takes antidiarrheal to try and prevent. Imodium works faster than lomotil. C/o pain in right side. Worse in morning when wake up. Sleeps on back. Not worse with meals or BM. Trying to wean off sleep aids. Goes to bed at 11 and wakes up at 3am.    Current Outpatient Prescriptions  Medication Sig Dispense Refill  . acetaminophen (TYLENOL) 500 MG tablet Take 500 mg by mouth every 6 (six) hours as needed.      . Ascorbic Acid (VITAMIN C) 1000 MG tablet Take 1,000 mg by mouth daily.      Marland Kitchen. atenolol (TENORMIN) 25 MG tablet Take 25 mg by mouth daily.      . calcium carbonate (OS-CAL) 600 MG TABS tablet Take 600 mg by mouth 2 (two) times daily with a meal.      . citalopram (CELEXA) 40 MG tablet Take 40 mg by mouth daily.      Marland Kitchen. denosumab (PROLIA) 60 MG/ML SOLN injection Inject 60 mg into the skin every 6 (six) months. Administer in upper arm, thigh, or abdomen      . diphenoxylate-atropine (LOMOTIL) 2.5-0.025 MG per tablet Take 1 tablet by mouth 4 (four) times daily as needed for diarrhea or loose stools.      Marland Kitchen. glucosamine-chondroitin 500-400 MG tablet Take 1 tablet by mouth 3 (three) times daily.      Marland Kitchen. loperamide (IMODIUM) 2 MG capsule Take 2 mg by mouth 2 (two) times daily as needed for diarrhea or loose stools.      . Multiple  Vitamin (MULTIVITAMIN) capsule Take 1 capsule by mouth daily.      Marland Kitchen. omeprazole (PRILOSEC) 20 MG capsule Take 20 mg by mouth daily.      . traZODone (DESYREL) 50 MG tablet Take 50 mg by mouth at bedtime.      Marland Kitchen. UNABLE TO FIND Kyo-Dophilus probiotics take one daily      . VITAMIN D, CHOLECALCIFEROL, PO Take 2,000 Units by mouth daily.      Marland Kitchen. zolpidem (AMBIEN) 5 MG tablet Take 5 mg by mouth at bedtime as needed for sleep.       No current facility-administered medications for this visit.    Allergies as of 07/13/2013 - Review Complete 07/13/2013  Allergen Reaction Noted  . Codeine    . Sulfamethoxazole-trimethoprim      Past Medical History  Diagnosis Date  . HTN (hypertension)   . Back pain   . Depression   . Kidney stones     Past Surgical History  Procedure Laterality Date  . Abdominal hysterectomy    . Cholecystectomy  2010  . Back surgery  1995  . Kidney stone surgery  2009/2011  . Esophagogastroduodenoscopy  05/2012    [facility]. Schatzki ring, gastritis, hh, pH probe study done, results not available to me  .  Esophagogastroduodenoscopy  12/2009    small hh, s/p empiric dilation  . Colonoscopy  12/2009    Dr. Jena Gaussourk: normal s/p segmental bx (benign)    Family History  Problem Relation Age of Onset  . Colon cancer Neg Hx   . Stroke Mother   . Stroke Father     History   Social History  . Marital Status: Widowed    Spouse Name: N/A    Number of Children: N/A  . Years of Education: N/A   Occupational History  . Not on file.   Social History Main Topics  . Smoking status: Never Smoker   . Smokeless tobacco: Not on file  . Alcohol Use: No  . Drug Use: No  . Sexual Activity: Not on file   Other Topics Concern  . Not on file   Social History Narrative  . No narrative on file      ROS:  General: Negative for anorexia, weight loss, fever, chills, fatigue, weakness. Eyes: Negative for vision changes.  ENT: Negative for hoarseness, difficulty  swallowing , nasal congestion. CV: Negative for chest pain, angina, palpitations, dyspnea on exertion, peripheral edema.  Respiratory: Negative for dyspnea at rest, dyspnea on exertion, cough, sputum, wheezing.  GI: See history of present illness. GU:  Negative for dysuria, hematuria, urinary incontinence, urinary frequency, nocturnal urination.  MS: Negative for joint pain, low back pain.  Derm: Negative for rash or itching.  Neuro: Negative for weakness, abnormal sensation, seizure, frequent headaches, memory loss, confusion.  Psych: Negative for anxiety, depression, suicidal ideation, hallucinations.  Endo: Negative for unusual weight change.  Heme: Negative for bruising or bleeding. Allergy: Negative for rash or hives.    Physical Examination:  BP 142/78  Pulse 57  Temp(Src) 97.3 F (36.3 C) (Oral)  Ht 5\' 1"  (1.549 m)  Wt 129 lb (58.514 kg)  BMI 24.39 kg/m2   General: Well-nourished, well-developed in no acute distress.  Head: Normocephalic, atraumatic.   Eyes: Conjunctiva pink, no icterus. Mouth: Oropharyngeal mucosa moist and pink , no lesions erythema or exudate. Neck: Supple without thyromegaly, masses, or lymphadenopathy.  Lungs: Clear to auscultation bilaterally.  Heart: Regular rate and rhythm, no murmurs rubs or gallops.  Abdomen: Bowel sounds are normal, nontender, nondistended, no hepatosplenomegaly or masses, no abdominal bruits or    hernia , no rebound or guarding.   Rectal: not performed Extremities: No lower extremity edema. No clubbing or deformities.  Neuro: Alert and oriented x 4 , grossly normal neurologically.  Skin: Warm and dry, no rash or jaundice.   Psych: Alert and cooperative, normal mood and affect.  Labs: Requested  Imaging Studies: No results found.

## 2013-07-13 NOTE — Patient Instructions (Signed)
1. Please have your stool test done. 2. Start Rozerem one capsule daily for insomnia. Wait until you wean off Trazadone and ambien. 3. Start Questran powder, 1/2 to 1 pack daily for diarrhea. Do not take within 2 hours of other medications.  4. Use imodium and/or lomotil only when needed. 5. I will review labs from your family doctor and once stool test are done we will call with further instructions.

## 2013-07-14 ENCOUNTER — Encounter: Payer: Self-pay | Admitting: Gastroenterology

## 2013-07-15 NOTE — Assessment & Plan Note (Signed)
Trial of Rozerem. F/u with PCP for further refills. Taper regimen for ambien, trazadone.

## 2013-07-15 NOTE — Assessment & Plan Note (Signed)
Chronic diarrhea. Previous w/u negative for microscopic colitis. Consider celiac, bile acid diarrhea. Check stool studies. Start Lanetta Inchquestran. Review labs from PCP. Further recommendations to follow.

## 2013-07-15 NOTE — Assessment & Plan Note (Signed)
Vague dysphagia symptoms. Patient reports symptoms worse with stress/anxiety. Doesn't matter what she eats. Normal esophagus at time of last EGD in 2011. If develops dysphagia to solid food on regular basis, consider BPE vs EGD. To discuss further with Dr. Jena Gaussourk.

## 2013-07-17 NOTE — Progress Notes (Signed)
cc'd to pcp 

## 2013-07-19 LAB — GIARDIA/CRYPTOSPORIDIUM (EIA)
CRYPTOSPORIDIUM SCREEN (EIA) (SOL): NEGATIVE
GIARDIA SCREEN (EIA): NEGATIVE

## 2013-07-19 LAB — CLOSTRIDIUM DIFFICILE BY PCR: CDIFFPCR: NOT DETECTED

## 2013-07-19 NOTE — Progress Notes (Signed)
Quick Note:  C.Diff negative. Await other stools. ______

## 2013-07-22 LAB — STOOL CULTURE

## 2013-07-25 ENCOUNTER — Other Ambulatory Visit (HOSPITAL_COMMUNITY): Payer: Self-pay | Admitting: Internal Medicine

## 2013-07-25 DIAGNOSIS — Z1231 Encounter for screening mammogram for malignant neoplasm of breast: Secondary | ICD-10-CM

## 2013-07-25 NOTE — Progress Notes (Signed)
Quick Note:  Stool negative. Where is fecal elastase? ______

## 2013-07-26 NOTE — Progress Notes (Signed)
Quick Note:  I called [facility]. The fecal elastase is set up to do on Tues and Thurs. And the turn around time is 7-9 days. LaRue said that we should have the results by next Wed. ______

## 2013-07-26 NOTE — Progress Notes (Signed)
Quick Note:  OK. Still need copy of labs from PCP done recently. ______

## 2013-07-27 NOTE — Progress Notes (Signed)
Records are in your cart.  

## 2013-07-27 NOTE — Progress Notes (Signed)
Requested Records from [facility].

## 2013-07-28 LAB — PANCREATIC ELASTASE, FECAL: PANCREATIC ELASTASE-1, STL: 284 ug/g

## 2013-07-28 NOTE — Progress Notes (Signed)
Quick Note:  All stools negative. Reviewed labs from PCP. How is she doing on Questran? ______

## 2013-07-28 NOTE — Progress Notes (Signed)
Reviewed records from PCP April 2015 White blood cell count 7700, hemoglobin 14.7, hematocrit 45.2, MCV 101.8, platelets 219,000, total bilirubin 0.5, alkaline phosphatase 46, AST 19, ALT 16, albumin 4.1, TSH 1.31.

## 2013-08-01 ENCOUNTER — Ambulatory Visit: Payer: Medicare PPO | Admitting: Gastroenterology

## 2013-08-01 ENCOUNTER — Ambulatory Visit (HOSPITAL_COMMUNITY)
Admission: RE | Admit: 2013-08-01 | Discharge: 2013-08-01 | Disposition: A | Discharge status: Home / Self Care | Payer: Medicare PPO | Source: Ambulatory Visit | Attending: Internal Medicine | Admitting: Internal Medicine

## 2013-08-01 DIAGNOSIS — Z1231 Encounter for screening mammogram for malignant neoplasm of breast: Secondary | ICD-10-CM | POA: Insufficient documentation

## 2013-08-03 ENCOUNTER — Telehealth: Payer: Self-pay | Admitting: *Deleted

## 2013-08-03 ENCOUNTER — Ambulatory Visit: Payer: Medicare PPO | Admitting: Gastroenterology

## 2013-08-03 NOTE — Telephone Encounter (Signed)
Pt wants to know what she can do to help her diarrhea problem, I made pt aware that Raynelle Fanning will be sending her information in the mail about it

## 2013-08-03 NOTE — Telephone Encounter (Signed)
Jessica Bauer, please check on patient. You documented speaking with her yesterday and she was "doing better". Not sure what Romie Jumper is referring to with regards to sending something in the mail.

## 2013-08-04 ENCOUNTER — Ambulatory Visit (HOSPITAL_COMMUNITY)
Admission: RE | Admit: 2013-08-04 | Discharge: 2013-08-04 | Disposition: A | Discharge status: Home / Self Care | Payer: Medicare PPO | Source: Ambulatory Visit | Attending: Family Medicine | Admitting: Family Medicine

## 2013-08-04 DIAGNOSIS — Z01419 Encounter for gynecological examination (general) (routine) without abnormal findings: Secondary | ICD-10-CM | POA: Insufficient documentation

## 2013-08-04 MED ORDER — HYDROCORTISONE ACETATE 25 MG RE SUPP: 25.0000 mg | Freq: Two times a day (BID) | RECTAL | Status: DC

## 2013-08-04 NOTE — Telephone Encounter (Signed)
Pt is wanting a diet sheet mailed to her on how to help manage diarrhea.

## 2013-08-04 NOTE — Telephone Encounter (Signed)
Spoke with patient. She is somewhat difficult historian. She reports that diarrhea seems to be better. Questran 1 pack each morning. Helps until the next morning after breakfast. Has to wait to take questran cause of other meds.  Going only 1-2 times. Sees blood mixed in the stool, has been going on for awhile. Lot of mucous. Blood three times per week. Last TCS 2011.   Discussed possibility of TCS. She would like to hold off for now. She can increase her questran to 1/2 to 1 pack in am and 1/2 pack in evening.   Please call her Monday and schedule f/u OV. Let's also get her on anusol supp for possible hemorrhoids. RX done, PLEASE NOTIFY PATIENT

## 2013-08-07 ENCOUNTER — Encounter: Payer: Self-pay | Admitting: Internal Medicine

## 2013-08-07 NOTE — Telephone Encounter (Signed)
Pt is aware of rx sent to the pharmacy.  

## 2013-08-07 NOTE — Telephone Encounter (Signed)
OV was made for 7/14 at 11 with [facility] and appt card mailed

## 2013-08-28 ENCOUNTER — Encounter (HOSPITAL_COMMUNITY)
Admission: RE | Admit: 2013-08-28 | Discharge: 2013-08-28 | Disposition: A | Discharge status: Home / Self Care | Payer: Medicare PPO | Source: Ambulatory Visit | Attending: Internal Medicine | Admitting: Internal Medicine

## 2013-08-28 DIAGNOSIS — M810 Age-related osteoporosis without current pathological fracture: Secondary | ICD-10-CM | POA: Diagnosis not present

## 2013-08-28 LAB — COMPREHENSIVE METABOLIC PANEL
ALT: 19 U/L (ref 0–35)
AST: 23 U/L (ref 0–37)
Albumin: 4.1 g/dL (ref 3.5–5.2)
Alkaline Phosphatase: 67 U/L (ref 39–117)
BUN: 16 mg/dL (ref 6–23)
CALCIUM: 9.8 mg/dL (ref 8.4–10.5)
CO2: 28 meq/L (ref 19–32)
CREATININE: 0.71 mg/dL (ref 0.50–1.10)
Chloride: 98 mEq/L (ref 96–112)
GFR, EST AFRICAN AMERICAN: 89 mL/min — AB (ref >90)
GFR, EST NON AFRICAN AMERICAN: 76 mL/min — AB (ref >90)
GLUCOSE: 111 mg/dL — AB (ref 70–99)
Potassium: 4.5 mEq/L (ref 3.7–5.3)
Sodium: 137 mEq/L (ref 137–147)
TOTAL PROTEIN: 7.8 g/dL (ref 6.0–8.3)
Total Bilirubin: 0.6 mg/dL (ref 0.3–1.2)

## 2013-08-28 LAB — PHOSPHORUS: PHOSPHORUS: 4 mg/dL (ref 2.3–4.6)

## 2013-08-28 LAB — MAGNESIUM: MAGNESIUM: 2.1 mg/dL (ref 1.5–2.5)

## 2013-08-28 MED ORDER — DENOSUMAB 60 MG/ML ~~LOC~~ SOLN
60.0000 mg | Freq: Once | SUBCUTANEOUS | Status: AC
  Administered 2013-08-28: 60 mg via SUBCUTANEOUS
  Filled 2013-08-28: qty 1

## 2013-08-28 NOTE — Discharge Instructions (Signed)
Denosumab injection What is this medicine? DENOSUMAB (den oh sue mab) slows bone breakdown. Prolia is used to treat osteoporosis in women after menopause and in men. Xgeva is used to prevent bone fractures and other bone problems caused by cancer bone metastases. Xgeva is also used to treat giant cell tumor of the bone. This medicine may be used for other purposes; ask your health care provider or pharmacist if you have questions. COMMON BRAND NAME(S): Prolia, XGEVA What should I tell my health care provider before I take this medicine? They need to know if you have any of these conditions: -dental disease -eczema -infection or history of infections -kidney disease or on dialysis -low blood calcium or vitamin D -malabsorption syndrome -scheduled to have surgery or tooth extraction -taking medicine that contains denosumab -thyroid or parathyroid disease -an unusual reaction to denosumab, other medicines, foods, dyes, or preservatives -pregnant or trying to get pregnant -breast-feeding How should I use this medicine? This medicine is for injection under the skin. It is given by a health care professional in a hospital or clinic setting. If you are getting Prolia, a special MedGuide will be given to you by the pharmacist with each prescription and refill. Be sure to read this information carefully each time. For Prolia, talk to your pediatrician regarding the use of this medicine in children. Special care may be needed. For Xgeva, talk to your pediatrician regarding the use of this medicine in children. While this drug may be prescribed for children as young as 13 years for selected conditions, precautions do apply. Overdosage: If you think you've taken too much of this medicine contact a poison control center or emergency room at once. Overdosage: If you think you have taken too much of this medicine contact a poison control center or emergency room at once. NOTE: This medicine is only for  you. Do not share this medicine with others. What if I miss a dose? It is important not to miss your dose. Call your doctor or health care professional if you are unable to keep an appointment. What may interact with this medicine? Do not take this medicine with any of the following medications: -other medicines containing denosumab This medicine may also interact with the following medications: -medicines that suppress the immune system -medicines that treat cancer -steroid medicines like prednisone or cortisone This list may not describe all possible interactions. Give your health care provider a list of all the medicines, herbs, non-prescription drugs, or dietary supplements you use. Also tell them if you smoke, drink alcohol, or use illegal drugs. Some items may interact with your medicine. What should I watch for while using this medicine? Visit your doctor or health care professional for regular checks on your progress. Your doctor or health care professional may order blood tests and other tests to see how you are doing. Call your doctor or health care professional if you get a cold or other infection while receiving this medicine. Do not treat yourself. This medicine may decrease your body's ability to fight infection. You should make sure you get enough calcium and vitamin D while you are taking this medicine, unless your doctor tells you not to. Discuss the foods you eat and the vitamins you take with your health care professional. See your dentist regularly. Brush and floss your teeth as directed. Before you have any dental work done, tell your dentist you are receiving this medicine. Do not become pregnant while taking this medicine or for 5 months after stopping   it. Women should inform their doctor if they wish to become pregnant or think they might be pregnant. There is a potential for serious side effects to an unborn child. Talk to your health care professional or pharmacist for more  information. What side effects may I notice from receiving this medicine? Side effects that you should report to your doctor or health care professional as soon as possible: -allergic reactions like skin rash, itching or hives, swelling of the face, lips, or tongue -breathing problems -chest pain -fast, irregular heartbeat -feeling faint or lightheaded, falls -fever, chills, or any other sign of infection -muscle spasms, tightening, or twitches -numbness or tingling -skin blisters or bumps, or is dry, peels, or red -slow healing or unexplained pain in the mouth or jaw -unusual bleeding or bruising Side effects that usually do not require medical attention (Report these to your doctor or health care professional if they continue or are bothersome.): -muscle pain -stomach upset, gas This list may not describe all possible side effects. Call your doctor for medical advice about side effects. You may report side effects to FDA at 1-800-FDA-1088. Where should I keep my medicine? This medicine is only given in a clinic, doctor's office, or other health care setting and will not be stored at home. NOTE: This sheet is a summary. It may not cover all possible information. If you have questions about this medicine, talk to your doctor, pharmacist, or health care provider.  2015, [employer]vier/Gold Standard. (2011-08-17 12:37:47)  

## 2013-09-12 ENCOUNTER — Ambulatory Visit (INDEPENDENT_AMBULATORY_CARE_PROVIDER_SITE_OTHER): Payer: Medicare PPO | Admitting: Gastroenterology

## 2013-09-12 ENCOUNTER — Encounter: Payer: Self-pay | Admitting: Gastroenterology

## 2013-09-12 ENCOUNTER — Encounter (INDEPENDENT_AMBULATORY_CARE_PROVIDER_SITE_OTHER): Payer: Self-pay

## 2013-09-12 VITALS — BP 142/81 | HR 59 | Temp 97.1°F | Resp 18 | Ht 61.0 in | Wt 122.0 lb

## 2013-09-12 DIAGNOSIS — R634 Abnormal weight loss: Secondary | ICD-10-CM

## 2013-09-12 DIAGNOSIS — R197 Diarrhea, unspecified: Secondary | ICD-10-CM

## 2013-09-12 NOTE — Progress Notes (Signed)
Primary Care Physician: Cassell SmilesFUSCO,LAWRENCE J., MD  Primary Gastroenterologist:  Roetta SessionsMichael Rourk, MD   Chief Complaint  Patient presents with  . Follow-up    HPI: Jessica Bauer is a 80 y.o. female here for followup of chronic diarrhea. Last seen 07/13/2013. Workup in 2011 was negative for microscopic colitis. At last office visit her C. difficile PCR was negative. Giardia and Cryptosporidium, stool culture negative. Pancreatic elastase was normal. She was offered a colonoscopy but declined. She was started on low-dose Questran.  Her weight is down 7 pounds since last OV however weighed 126 four years ago. Takes about one pack of questran daily. Rarely takes imodium. Not taking Lomotil. Not as hungry. Not eating dairy anymore, so losing some calories. Usually skips breakfast.  Denies abdominal pain. No heartburn. No complaints with swallowing. Does not want a colonoscopy if can avoid it.   Current Outpatient Prescriptions  Medication Sig Dispense Refill  . acetaminophen (TYLENOL) 500 MG tablet Take 500 mg by mouth every 6 (six) hours as needed.      . Ascorbic Acid (VITAMIN C) 1000 MG tablet Take 1,000 mg by mouth daily.      Marland Kitchen. atenolol (TENORMIN) 25 MG tablet Take 25 mg by mouth daily.      . calcium carbonate (OS-CAL) 600 MG TABS tablet Take 600 mg by mouth 2 (two) times daily with a meal.      . cholestyramine (QUESTRAN) 4 G packet Take 1/2 pack to 1 pack once daily for diarrhea. Do not take within 2 hours of other medications.  30 each  12  . citalopram (CELEXA) 40 MG tablet Take 40 mg by mouth daily.      Marland Kitchen. denosumab (PROLIA) 60 MG/ML SOLN injection Inject 60 mg into the skin every 6 (six) months. Administer in upper arm, thigh, or abdomen      . diphenoxylate-atropine (LOMOTIL) 2.5-0.025 MG per tablet Take 1 tablet by mouth 4 (four) times daily as needed for diarrhea or loose stools.      Marland Kitchen. glucosamine-chondroitin 500-400 MG tablet Take 1 tablet by mouth 3 (three) times daily.       . hydrocortisone (ANUSOL-HC) 25 MG suppository Place 1 suppository (25 mg total) rectally every 12 (twelve) hours.  24 suppository  1  . loperamide (IMODIUM) 2 MG capsule Take 2 mg by mouth 2 (two) times daily as needed for diarrhea or loose stools.      . Multiple Vitamin (MULTIVITAMIN) capsule Take 1 capsule by mouth daily.      Marland Kitchen. omeprazole (PRILOSEC) 20 MG capsule Take 20 mg by mouth daily.      . ramelteon (ROZEREM) 8 MG tablet Take 1 tablet (8 mg total) by mouth at bedtime.  30 tablet  0  . traZODone (DESYREL) 50 MG tablet Take 50 mg by mouth at bedtime.      Marland Kitchen. UNABLE TO FIND Kyo-Dophilus probiotics take one daily      . VITAMIN D, CHOLECALCIFEROL, PO Take 2,000 Units by mouth daily.      Marland Kitchen. zolpidem (AMBIEN) 5 MG tablet Take 5 mg by mouth at bedtime as needed for sleep.       No current facility-administered medications for this visit.    Allergies as of 09/12/2013 - Review Complete 09/12/2013  Allergen Reaction Noted  . Codeine    . Sulfamethoxazole-trimethoprim      ROS:  General: Negative for anorexia, weight loss, fever, chills, fatigue, weakness. ENT: Negative for hoarseness, difficulty swallowing ,  nasal congestion. CV: Negative for chest pain, angina, palpitations, dyspnea on exertion, peripheral edema.  Respiratory: Negative for dyspnea at rest, dyspnea on exertion, cough, sputum, wheezing.  GI: See history of present illness. GU:  Negative for dysuria, hematuria, urinary incontinence, urinary frequency, nocturnal urination.  Endo: Negative for unusual weight change.    Physical Examination:   BP 142/81  Pulse 59  Temp(Src) 97.1 F (36.2 C) (Oral)  Resp 18  Ht 5\' 1"  (1.549 m)  Wt 122 lb (55.339 kg)  BMI 23.06 kg/m2  General: Well-nourished, well-developed in no acute distress.  Eyes: No icterus. Mouth: Oropharyngeal mucosa moist and pink , no lesions erythema or exudate. Lungs: Clear to auscultation bilaterally.  Heart: Regular rate and rhythm, no murmurs  rubs or gallops.  Abdomen: Bowel sounds are normal, nontender, nondistended, no hepatosplenomegaly or masses, no abdominal bruits or hernia , no rebound or guarding.   Extremities: No lower extremity edema. No clubbing or deformities. Neuro: Alert and oriented x 4   Skin: Warm and dry, no jaundice.   Psych: Alert and cooperative, normal mood and affect.

## 2013-09-12 NOTE — Patient Instructions (Signed)
1. Please [facility] your [facility]. 2. Weigh yourself today. Then weight yourself each week. If you drop more than 3-4 pounds, let me know. 3. Add a protein shake or carnation instant breakfast to your daily diet.  4. Office visit in 3 months.

## 2013-09-12 NOTE — Assessment & Plan Note (Addendum)
Doing well on Questran. ?bile acid diarrhea. Extensive work up previously and patient not interested in colonoscopy at this time. Offered her TTG, IgA level to screen for celiac disease. Encouraged adding supplemental drink at breakfast. Monitor weight and call with further loss. Ov in 3 months with Dr. Jena Gaussourk.

## 2013-09-13 NOTE — Progress Notes (Signed)
cc'd to pcp 

## 2013-09-15 LAB — IGA: IGA: 454 mg/dL — AB (ref 69–380)

## 2013-09-18 LAB — TISSUE TRANSGLUTAMINASE, IGA: TISSUE TRANSGLUTAMINASE AB, IGA: 16.6 U/mL (ref <20)

## 2013-09-19 NOTE — Progress Notes (Signed)
Quick Note:  Please let patient know her celiac screen was negative. Her IgA level is high which may be nonspecific. I would like for her to have IgG, IgM, IgA done in one month. ______

## 2013-09-22 ENCOUNTER — Telehealth: Payer: Self-pay | Admitting: Internal Medicine

## 2013-09-22 NOTE — Telephone Encounter (Signed)
Tried to call pt- phone number was busy.  

## 2013-09-22 NOTE — Telephone Encounter (Signed)
Pt was calling for results. Please call her [phone]

## 2013-09-26 NOTE — Telephone Encounter (Signed)
Tried to call pt- phone number was busy.  

## 2013-09-27 ENCOUNTER — Other Ambulatory Visit: Payer: Self-pay | Admitting: Gastroenterology

## 2013-09-27 DIAGNOSIS — R768 Other specified abnormal immunological findings in serum: Secondary | ICD-10-CM

## 2013-09-27 NOTE — Telephone Encounter (Signed)
Letter mailed to pt.  

## 2013-10-06 ENCOUNTER — Telehealth: Payer: Self-pay | Admitting: Internal Medicine

## 2013-10-06 NOTE — Telephone Encounter (Signed)
Tried to call pt with no answer

## 2013-10-06 NOTE — Telephone Encounter (Signed)
Pt called this morning to say that she received a letter from us saying we could not reach her by phone. She was asking about her gluten test. Please call her back at [phone]

## 2013-10-10 NOTE — Telephone Encounter (Signed)
I spoke with pt and answered her questions.

## 2013-10-12 ENCOUNTER — Other Ambulatory Visit: Payer: Self-pay

## 2013-10-12 DIAGNOSIS — R768 Other specified abnormal immunological findings in serum: Secondary | ICD-10-CM

## 2013-10-28 ENCOUNTER — Encounter (HOSPITAL_COMMUNITY): Payer: Self-pay | Admitting: Emergency Medicine

## 2013-10-28 ENCOUNTER — Inpatient Hospital Stay (HOSPITAL_COMMUNITY)
Admission: EM | Admit: 2013-10-28 | Discharge: 2013-10-30 | DRG: 603 | Disposition: A | Discharge status: Home / Self Care | Payer: Medicare PPO | Attending: Internal Medicine | Admitting: Internal Medicine

## 2013-10-28 ENCOUNTER — Emergency Department (HOSPITAL_COMMUNITY): Payer: Medicare PPO

## 2013-10-28 DIAGNOSIS — R1319 Other dysphagia: Secondary | ICD-10-CM | POA: Diagnosis present

## 2013-10-28 DIAGNOSIS — B3731 Acute candidiasis of vulva and vagina: Secondary | ICD-10-CM | POA: Diagnosis present

## 2013-10-28 DIAGNOSIS — B373 Candidiasis of vulva and vagina: Secondary | ICD-10-CM

## 2013-10-28 DIAGNOSIS — Z79899 Other long term (current) drug therapy: Secondary | ICD-10-CM

## 2013-10-28 DIAGNOSIS — R197 Diarrhea, unspecified: Secondary | ICD-10-CM | POA: Diagnosis present

## 2013-10-28 DIAGNOSIS — I10 Essential (primary) hypertension: Secondary | ICD-10-CM | POA: Diagnosis present

## 2013-10-28 DIAGNOSIS — L02419 Cutaneous abscess of limb, unspecified: Principal | ICD-10-CM | POA: Diagnosis present

## 2013-10-28 DIAGNOSIS — L03116 Cellulitis of left lower limb: Secondary | ICD-10-CM | POA: Diagnosis present

## 2013-10-28 DIAGNOSIS — L03119 Cellulitis of unspecified part of limb: Principal | ICD-10-CM

## 2013-10-28 DIAGNOSIS — Z823 Family history of stroke: Secondary | ICD-10-CM

## 2013-10-28 DIAGNOSIS — M810 Age-related osteoporosis without current pathological fracture: Secondary | ICD-10-CM | POA: Diagnosis present

## 2013-10-28 DIAGNOSIS — R1310 Dysphagia, unspecified: Secondary | ICD-10-CM | POA: Diagnosis present

## 2013-10-28 DIAGNOSIS — Z882 Allergy status to sulfonamides status: Secondary | ICD-10-CM

## 2013-10-28 DIAGNOSIS — M25579 Pain in unspecified ankle and joints of unspecified foot: Secondary | ICD-10-CM | POA: Diagnosis not present

## 2013-10-28 LAB — BASIC METABOLIC PANEL
ANION GAP: 9 (ref 5–15)
BUN: 12 mg/dL (ref 6–23)
CHLORIDE: 103 meq/L (ref 96–112)
CO2: 28 meq/L (ref 19–32)
Calcium: 8.8 mg/dL (ref 8.4–10.5)
Creatinine, Ser: 0.64 mg/dL (ref 0.50–1.10)
GFR calc Af Amer: >90 mL/min (ref >90)
GFR calc non Af Amer: 79 mL/min — ABNORMAL LOW (ref >90)
Glucose, Bld: 89 mg/dL (ref 70–99)
Potassium: 4 mEq/L (ref 3.7–5.3)
Sodium: 140 mEq/L (ref 137–147)

## 2013-10-28 LAB — CBC WITH DIFFERENTIAL/PLATELET
BASOS ABS: 0 10*3/uL (ref 0.0–0.1)
BASOS PCT: 0 % (ref 0–1)
EOS PCT: 1 % (ref 0–5)
Eosinophils Absolute: 0.1 10*3/uL (ref 0.0–0.7)
HCT: 39.9 % (ref 36.0–46.0)
HEMOGLOBIN: 13.4 g/dL (ref 12.0–15.0)
Lymphocytes Relative: 33 % (ref 12–46)
Lymphs Abs: 2.1 10*3/uL (ref 0.7–4.0)
MCH: 33.8 pg (ref 26.0–34.0)
MCHC: 33.6 g/dL (ref 30.0–36.0)
MCV: 100.8 fL — AB (ref 78.0–100.0)
Monocytes Absolute: 0.6 10*3/uL (ref 0.1–1.0)
Monocytes Relative: 9 % (ref 3–12)
NEUTROS ABS: 3.7 10*3/uL (ref 1.7–7.7)
Neutrophils Relative %: 57 % (ref 43–77)
Platelets: 198 10*3/uL (ref 150–400)
RBC: 3.96 MIL/uL (ref 3.87–5.11)
RDW: 12.6 % (ref 11.5–15.5)
WBC: 6.5 10*3/uL (ref 4.0–10.5)

## 2013-10-28 LAB — LACTIC ACID, PLASMA: Lactic Acid, Venous: 0.8 mmol/L (ref 0.5–2.2)

## 2013-10-28 MED ORDER — VANCOMYCIN HCL IN DEXTROSE 1-5 GM/200ML-% IV SOLN
1000.0000 mg | Freq: Once | INTRAVENOUS | Status: AC
  Administered 2013-10-28: 1000 mg via INTRAVENOUS
  Filled 2013-10-28: qty 200

## 2013-10-28 MED ORDER — ENOXAPARIN SODIUM 40 MG/0.4ML ~~LOC~~ SOLN
40.0000 mg | SUBCUTANEOUS | Status: DC
  Administered 2013-10-28 – 2013-10-29 (×2): 40 mg via SUBCUTANEOUS
  Filled 2013-10-28 (×2): qty 0.4

## 2013-10-28 MED ORDER — ACETAMINOPHEN 325 MG PO TABS
650.0000 mg | ORAL_TABLET | Freq: Four times a day (QID) | ORAL | Status: DC | PRN
  Administered 2013-10-28 – 2013-10-30 (×6): 650 mg via ORAL
  Filled 2013-10-28 (×6): qty 2

## 2013-10-28 MED ORDER — ACETAMINOPHEN 650 MG RE SUPP: 650.0000 mg | Freq: Four times a day (QID) | RECTAL | Status: DC | PRN

## 2013-10-28 MED ORDER — LOPERAMIDE HCL 2 MG PO CAPS
2.0000 mg | ORAL_CAPSULE | Freq: Two times a day (BID) | ORAL | Status: DC | PRN
  Administered 2013-10-29: 2 mg via ORAL
  Filled 2013-10-28: qty 1

## 2013-10-28 MED ORDER — DIPHENOXYLATE-ATROPINE 2.5-0.025 MG PO TABS: 1.0000 | ORAL_TABLET | Freq: Four times a day (QID) | ORAL | Status: DC | PRN

## 2013-10-28 MED ORDER — VANCOMYCIN HCL 500 MG IV SOLR
500.0000 mg | INTRAVENOUS | Status: DC
  Administered 2013-10-29 – 2013-10-30 (×2): 500 mg via INTRAVENOUS
  Filled 2013-10-28 (×2): qty 500

## 2013-10-28 MED ORDER — ALUM & MAG HYDROXIDE-SIMETH 200-200-20 MG/5ML PO SUSP: 30.0000 mL | Freq: Four times a day (QID) | ORAL | Status: DC | PRN

## 2013-10-28 MED ORDER — ATENOLOL 25 MG PO TABS
25.0000 mg | ORAL_TABLET | Freq: Every day | ORAL | Status: DC
  Administered 2013-10-28 – 2013-10-30 (×3): 25 mg via ORAL
  Filled 2013-10-28 (×3): qty 1

## 2013-10-28 MED ORDER — PANTOPRAZOLE SODIUM 40 MG PO TBEC
40.0000 mg | DELAYED_RELEASE_TABLET | Freq: Every day | ORAL | Status: DC
  Administered 2013-10-28 – 2013-10-29 (×2): 40 mg via ORAL
  Filled 2013-10-28 (×2): qty 1

## 2013-10-28 MED ORDER — FLUCONAZOLE 100 MG PO TABS
150.0000 mg | ORAL_TABLET | Freq: Once | ORAL | Status: AC
  Administered 2013-10-28: 150 mg via ORAL
  Filled 2013-10-28: qty 2

## 2013-10-28 MED ORDER — TRAZODONE HCL 50 MG PO TABS
50.0000 mg | ORAL_TABLET | Freq: Every day | ORAL | Status: DC
  Administered 2013-10-28 – 2013-10-29 (×2): 50 mg via ORAL
  Filled 2013-10-28 (×2): qty 1

## 2013-10-28 MED ORDER — CHOLESTYRAMINE 4 G PO PACK
4.0000 g | PACK | Freq: Every day | ORAL | Status: DC | PRN
  Filled 2013-10-28: qty 1

## 2013-10-28 NOTE — ED Provider Notes (Signed)
CSN: [id number]     Arrival date & time 10/28/13  1317 History   First MD Initiated Contact with Patient 10/28/13 1428     Chief Complaint  Patient presents with  . Ankle Pain      HPI Pt was seen at 1450.  Per pt, c/o gradual onset and worsening of persistent left lower leg "redness" for the past 2 weeks. Pt states she was evaluated by her PMD on 10/09/2013 for same, rx doxycycline after "he shaved off some skin in 2 areas with a razor blade." States the redness around both areas have become increasingly erythematous over the past several days, despite taking the doxycycline as prescribed. Denies fevers, no focal motor weakness, no injury.    Past Medical History  Diagnosis Date  . HTN (hypertension)   . Back pain   . Depression   . Kidney stones    Past Surgical History  Procedure Laterality Date  . Abdominal hysterectomy    . Cholecystectomy  2010  . Back surgery  1995  . Kidney stone surgery  2009/2011  . Esophagogastroduodenoscopy  05/2012    [facility]. Schatzki ring, gastritis, hh, pH probe study done, results not available to me  . Esophagogastroduodenoscopy  12/2009    small hh, s/p empiric dilation  . Colonoscopy  12/2009    Dr. Jena Gauss: normal s/p segmental bx (benign)   Family History  Problem Relation Age of Onset  . Colon cancer Neg Hx   . Stroke Mother   . Stroke Father    History  Substance Use Topics  . Smoking status: Never Smoker   . Smokeless tobacco: Not on file  . Alcohol Use: No    Review of Systems ROS: Statement: All systems negative except as marked or noted in the HPI; Constitutional: Negative for fever and chills. ; ; Eyes: Negative for eye pain, redness and discharge. ; ; ENMT: Negative for ear pain, hoarseness, nasal congestion, sinus pressure and sore throat. ; ; Cardiovascular: Negative for chest pain, palpitations, diaphoresis, dyspnea and peripheral edema. ; ; Respiratory: Negative for cough, wheezing and stridor. ; ; Gastrointestinal:  Negative for nausea, vomiting, diarrhea, abdominal pain, blood in stool, hematemesis, jaundice and rectal bleeding. . ; ; Genitourinary: Negative for dysuria, flank pain and hematuria. ; ; Musculoskeletal: Negative for back pain and neck pain. Negative for swelling and trauma.; ; Skin: Negative for pruritus, abrasions, blisters, bruising and +rash, +skin lesion.; ; Neuro: Negative for headache, lightheadedness and neck stiffness. Negative for weakness, altered level of consciousness , altered mental status, extremity weakness, paresthesias, involuntary movement, seizure and syncope.     Allergies  Codeine and Sulfamethoxazole-trimethoprim  Home Medications   Prior to Admission medications   Medication Sig Start Date End Date Taking? Authorizing Provider  acetaminophen (TYLENOL) 500 MG tablet Take 500 mg by mouth every 6 (six) hours as needed.   Yes Historical Provider, MD  Ascorbic Acid (VITAMIN C) 1000 MG tablet Take 1,000 mg by mouth daily.   Yes Historical Provider, MD  atenolol (TENORMIN) 25 MG tablet Take 25 mg by mouth daily.   Yes Historical Provider, MD  calcium carbonate (OS-CAL) 600 MG TABS tablet Take 600 mg by mouth 2 (two) times daily with a meal.   Yes Historical Provider, MD  cholestyramine (QUESTRAN) 4 G packet Take 1/2 pack to 1 pack once daily for diarrhea. Do not take within 2 hours of other medications. 07/13/13  Yes Tiffany Kocher, PA-C  citalopram (CELEXA) 40 MG tablet Take  40 mg by mouth daily.   Yes Historical Provider, MD  denosumab (PROLIA) 60 MG/ML SOLN injection Inject 60 mg into the skin every 6 (six) months. Administer in upper arm, thigh, or abdomen   Yes Historical Provider, MD  diphenoxylate-atropine (LOMOTIL) 2.5-0.025 MG per tablet Take 1 tablet by mouth 4 (four) times daily as needed for diarrhea or loose stools.   Yes Historical Provider, MD  doxycycline (VIBRA-TABS) 100 MG tablet Take 100 mg by mouth 2 (two) times daily. For 14 days(started 10/09/13)   Yes  Historical Provider, MD  glucosamine-chondroitin 500-400 MG tablet Take 1 tablet by mouth 3 (three) times daily.   Yes Historical Provider, MD  loperamide (IMODIUM) 2 MG capsule Take 2 mg by mouth 2 (two) times daily as needed for diarrhea or loose stools.   Yes Historical Provider, MD  Multiple Vitamin (MULTIVITAMIN) capsule Take 1 capsule by mouth daily.   Yes Historical Provider, MD  omeprazole (PRILOSEC) 20 MG capsule Take 20 mg by mouth daily.   Yes Historical Provider, MD  ramelteon (ROZEREM) 8 MG tablet Take 1 tablet (8 mg total) by mouth at bedtime. 07/13/13  Yes Tiffany Kocher, PA-C  traZODone (DESYREL) 50 MG tablet Take 50 mg by mouth at bedtime.   Yes Historical Provider, MD  UNABLE TO FIND Kyo-Dophilus probiotics take one daily   Yes Historical Provider, MD  VITAMIN D, CHOLECALCIFEROL, PO Take 2,000 Units by mouth daily.   Yes Historical Provider, MD  zolpidem (AMBIEN) 5 MG tablet Take 5 mg by mouth at bedtime as needed for sleep.   Yes Historical Provider, MD   BP 164/80  Pulse 95  Temp(Src) 98.7 F (37.1 C) (Oral)  Resp 16  Ht  (1.549 m)  Wt 122 lb (55.339 kg)  BMI 23.06 kg/m2  SpO2 99% Physical Exam 1455: Physical examination:  Nursing notes reviewed; Vital signs and O2 SAT reviewed;  Constitutional: Well developed, Well nourished, Well hydrated, In no acute distress; Head:  Normocephalic, atraumatic; Eyes: EOMI, PERRL, No scleral icterus; ENMT: Mouth and pharynx normal, Mucous membranes moist; Neck: Supple, Full range of motion, No lymphadenopathy; Cardiovascular: Regular rate and rhythm, No murmur, rub, or gallop; Respiratory: Breath sounds clear & equal bilaterally, No rales, rhonchi, wheezes.  Speaking full sentences with ease, Normal respiratory effort/excursion; Chest: Nontender, Movement normal; Abdomen: Soft, Nontender, Nondistended, Normal bowel sounds; Genitourinary: No CVA tenderness; Extremities: Pulses normal, No tenderness, +approximately 1cm diameter scab to  left distal medial lower leg with surrounding erythema, no fluctuance, no open wounds, no drainage. +approximately 1cm diameter open wound to left distal lateral lower leg with surrounding erythema extending to lateral ankle and foot, no fluctuance, no drainage. No ecchymosis, no blisters, no soft tissue crepitus. No calf edema or asymmetry.; Neuro: AA&Ox3, Major CN grossly intact.  Speech clear. No gross focal motor or sensory deficits in extremities.; Skin: Color normal, Warm, Dry.   ED Course  Procedures    MDM  MDM Reviewed: previous chart, nursing note and vitals Reviewed previous: labs Interpretation: labs and x-ray     Results for orders placed during the hospital encounter of 10/28/13  CBC WITH DIFFERENTIAL      Result Value Ref Range   WBC 6.5  4.0 - 10.5 K/uL   RBC 3.96  3.87 - 5.11 MIL/uL   Hemoglobin 13.4  12.0 - 15.0 g/dL   HCT 57.8  46.9 - 62.9 %   MCV 100.8 (*) 78.0 - 100.0 fL   MCH 33.8  26.0 -  34.0 pg   MCHC 33.6  30.0 - 36.0 g/dL   RDW 40.9  81.1 - 91.4 %   Platelets 198  150 - 400 K/uL   Neutrophils Relative % 57  43 - 77 %   Neutro Abs 3.7  1.7 - 7.7 K/uL   Lymphocytes Relative 33  12 - 46 %   Lymphs Abs 2.1  0.7 - 4.0 K/uL   Monocytes Relative 9  3 - 12 %   Monocytes Absolute 0.6  0.1 - 1.0 K/uL   Eosinophils Relative 1  0 - 5 %   Eosinophils Absolute 0.1  0.0 - 0.7 K/uL   Basophils Relative 0  0 - 1 %   Basophils Absolute 0.0  0.0 - 0.1 K/uL  BASIC METABOLIC PANEL      Result Value Ref Range   Sodium 140  137 - 147 mEq/L   Potassium 4.0  3.7 - 5.3 mEq/L   Chloride 103  96 - 112 mEq/L   CO2 28  19 - 32 mEq/L   Glucose, Bld 89  70 - 99 mg/dL   BUN 12  6 - 23 mg/dL   Creatinine, Ser 7.82  0.50 - 1.10 mg/dL   Calcium 8.8  8.4 - 95.6 mg/dL   GFR calc non Af Amer 79 (*) >90 mL/min   GFR calc Af Amer >90  >90 mL/min   Anion gap 9  5 - 15  LACTIC ACID, PLASMA      Result Value Ref Range   Lactic Acid, Venous 0.8  0.5 - 2.2 mmol/L   Dg Ankle  Complete Left 10/28/2013   CLINICAL DATA:  Left ankle pain, redness, swelling.  EXAM: LEFT ANKLE COMPLETE - 3+ VIEW  COMPARISON:  None.  FINDINGS: There is no evidence of fracture, dislocation, or joint effusion. There is no evidence of arthropathy or other focal bone abnormality. Soft tissues are unremarkable.  IMPRESSION: Negative.   Electronically Signed   By: Charlett Nose M.D.   On: 10/28/2013 15:30    1610:  Tx failure on doxycycline; will dose IV vancomycin and observation admit. Dx and testing d/w pt and family.  Questions answered.  Verb understanding, agreeable to admit.  T/C to [facility] Dr. Adrian Blackwater, case discussed, including:  HPI, pertinent PM/SHx, VS/PE, dx testing, ED course and treatment:  Agreeable to observation admit, requests to write temporary orders, obtain medical bed to team 2.   Samuel Jester, DO 10/30/13 [phone]

## 2013-10-28 NOTE — Progress Notes (Signed)
Pharmacy Note:  Initial antibiotics for Vancomycin ordered by EDP for cellulitis.  Estimated Creatinine Clearance: 38.1 ml/min (by C-G formula based on Cr of 0.71).   Allergies  Allergen Reactions  . Codeine   . Sulfamethoxazole-Trimethoprim     Filed Vitals:   10/28/13 1319  BP: 164/80  Pulse: 95  Temp: 98.7 F (37.1 C)  Resp: 16    Anti-infectives   None      Plan: Initial dosing of  IV x1 Vancomycin. F/U admission orders for further dosing if therapy continued.  Mady Gemma, [facility] 10/28/2013 3:54 PM

## 2013-10-28 NOTE — ED Notes (Signed)
Pt reports saw PCP for cyst on left ankle on august 16th. PCP I&D. Pt reports increased with pain and swelling on left ankle. Pt denies any known fevers. Pt able to bear weight on extremity. nad noted. Mild redness noted to left ankle.

## 2013-10-28 NOTE — Progress Notes (Signed)
ANTIBIOTIC CONSULT NOTE - FOLLOW UP  Pharmacy Consult for Vancomcyin Indication: cellulitis   Allergies  Allergen Reactions  . Codeine   . Sulfamethoxazole-Trimethoprim Hives    Patient Measurements: Height:  (154.9 cm) Weight: 122 lb (55.339 kg) IBW/kg (Calculated) : 47.8  Vital Signs: Temp: 98.7 F (37.1 C) (08/29 1319) Temp src: Oral (08/29 1330) BP: 164/80 mmHg (08/29 1319) Pulse Rate: 95 (08/29 1319) Intake/Output from previous day:   Intake/Output from this shift:    Labs:  Recent Labs  10/28/13 1529  WBC 6.5  HGB 13.4  PLT 198  CREATININE 0.64   Estimated Creatinine Clearance: 38.1 ml/min (by C-G formula based on Cr of 0.64). No results found for this basename: VANCOTROUGH, VANCOPEAK, VANCORANDOM, GENTTROUGH, GENTPEAK, GENTRANDOM, TOBRATROUGH, TOBRAPEAK, TOBRARND, AMIKACINPEAK, AMIKACINTROU, AMIKACIN,  in the last 72 hours   Microbiology: No results found for this or any previous visit (from the past 720 hour(s)).  Anti-infectives   Start     Dose/Rate Route Frequency Ordered Stop   10/28/13 1600  vancomycin (VANCOCIN) IVPB 1000 mg/200 mL premix     1,000 mg 200 mL/hr over 60 Minutes Intravenous  Once 10/28/13 1555 10/28/13 1729      Assessment: Okay for Protocol, cellulitis left lower leg  Failed outpatient treatment with doxycycline.  Initial dose given in ED.  Goal of Therapy:  Vancomycin trough level 10-15 mcg/ml  Plan:  Vancomycin  IV every 24 hours. Measure antibiotic drug levels at steady state Follow up culture results  Mady Gemma 10/28/2013,5:30 PM

## 2013-10-28 NOTE — H&P (Signed)
History and Physical  Jessica Bauer AVW:[id number] DOB: Oct 06, 1925 DOA: 10/28/2013  Referring physician: Dr Clarene Duke, ED physician PCP: Cassell Smiles., MD   Chief Complaint: Swelling of leg  HPI: Jessica Bauer is a 80 y.o. female  Hypertension, osteoporosis, chronic diarrhea. She is seen due to nonhealing wounds on her left leg. She was seen by the dermatologist on 10/09/2013 due to a cyst that was removed on her left lateral lower leg, a few centimeters above her lateral malleolus. Additionally, she had a suspicious skin lesion, later found to be benign, removed a few centimeters above her medial malleolus of the same leg at the same visit. She was prophylactically put on doxycycline, with the last dose today. The wounds have not healed and over the past several days, there has been increased erythema and swelling to the area. The patient was directed to be seen here.   Review of Systems:   Pt complains of vaginal itching.  Pt denies any fevers, chills, nausea, vomiting, abdominal pain, chest pain, shortness of breath, diarrhea.  Review of systems are otherwise negative  Past Medical History  Diagnosis Date  . HTN (hypertension)   . Back pain   . Depression   . Kidney stones    Past Surgical History  Procedure Laterality Date  . Abdominal hysterectomy    . Cholecystectomy  2010  . Back surgery  1995  . Kidney stone surgery  2009/2011  . Esophagogastroduodenoscopy  05/2012    [facility]. Schatzki ring, gastritis, hh, pH probe study done, results not available to me  . Esophagogastroduodenoscopy  12/2009    small hh, s/p empiric dilation  . Colonoscopy  12/2009    Dr. Jena Gauss: normal s/p segmental bx (benign)   Social History:  reports that she has never smoked. She does not have any smokeless tobacco history on file. She reports that she does not drink alcohol or use illicit drugs. Patient lives at home & is able to participate in activities of daily living without  assistance  Allergies  Allergen Reactions  . Codeine   . Sulfamethoxazole-Trimethoprim Hives    Family History  Problem Relation Age of Onset  . Colon cancer Neg Hx   . Stroke Mother   . Stroke Father      Prior to Admission medications   Medication Sig Start Date End Date Taking? Authorizing Provider  acetaminophen (TYLENOL) 500 MG tablet Take 500 mg by mouth every 6 (six) hours as needed.   Yes Historical Provider, MD  Ascorbic Acid (VITAMIN C) 1000 MG tablet Take 1,000 mg by mouth daily.   Yes Historical Provider, MD  atenolol (TENORMIN) 25 MG tablet Take 25 mg by mouth daily.   Yes Historical Provider, MD  calcium carbonate (OS-CAL) 600 MG TABS tablet Take 600 mg by mouth 2 (two) times daily with a meal.   Yes Historical Provider, MD  cholestyramine (QUESTRAN) 4 G packet Take 1/2 pack to 1 pack once daily for diarrhea. Do not take within 2 hours of other medications. 07/13/13  Yes Tiffany Kocher, PA-C  denosumab (PROLIA) 60 MG/ML SOLN injection Inject 60 mg into the skin every 6 (six) months. Administer in upper arm, thigh, or abdomen   Yes Historical Provider, MD  diphenoxylate-atropine (LOMOTIL) 2.5-0.025 MG per tablet Take 1 tablet by mouth 4 (four) times daily as needed for diarrhea or loose stools.   Yes Historical Provider, MD  glucosamine-chondroitin 500-400 MG tablet Take 1 tablet by mouth 3 (three) times daily.  Yes Historical Provider, MD  loperamide (IMODIUM) 2 MG capsule Take 2 mg by mouth 2 (two) times daily as needed for diarrhea or loose stools.   Yes Historical Provider, MD  Multiple Vitamin (MULTIVITAMIN) capsule Take 1 capsule by mouth daily.   Yes Historical Provider, MD  omeprazole (PRILOSEC) 20 MG capsule Take 20 mg by mouth daily.   Yes Historical Provider, MD  ramelteon (ROZEREM) 8 MG tablet Take 1 tablet (8 mg total) by mouth at bedtime. 07/13/13  Yes Tiffany Kocher, PA-C  traZODone (DESYREL) 50 MG tablet Take 50 mg by mouth at bedtime.   Yes Historical  Provider, MD  UNABLE TO FIND Kyo-Dophilus probiotics take one daily   Yes Historical Provider, MD  VITAMIN D, CHOLECALCIFEROL, PO Take 2,000 Units by mouth daily.   Yes Historical Provider, MD    Physical Exam: BP 164/80  Pulse 95  Temp(Src) 98.7 F (37.1 C) (Oral)  Resp 16  Ht  (1.549 m)  Wt 55.339 kg (122 lb)  BMI 23.06 kg/m2  SpO2 99%  General: Elderly Caucasian female. Awake and alert and oriented x3. No acute cardiopulmonary distress.  Eyes: Pupils equal, round, reactive to light. Extraocular muscles are intact. Sclerae anicteric and noninjected.  ENT: External auditory canals are patent and tympanic membranes reflect a good cone of light. Moist mucosal membranes. No mucosal lesions. Teeth in good repair  Neck: Neck supple without lymphadenopathy. No carotid bruits. No masses palpated.  Cardiovascular: Regular rate with normal S1-S2 sounds. No murmurs, rubs, gallops auscultated. No JVD.  Respiratory: Good respiratory effort with no wheezes, rales, rhonchi. Lungs clear to auscultation bilaterally.  Abdomen: Soft, nontender, nondistended. Active bowel sounds. No masses or hepatosplenomegaly  Skin: Dry, warm to touch. 2+ dorsalis pedis and radial pulses. There is a 1.5 cm nonhealing wound a couple centimeters above her left lateral malleolus. There is a 4 cm erythematous and warm area that extends distal from that wound. Additionally, there is a 1 cm black eschar a few centimeters above her left medial malleolus with approximately 3 cm erythematous and warm area distal to that.   Musculoskeletal: No calf or leg pain. All major joints not erythematous nontender.  Psychiatric: Intact judgment and insight.  Neurologic: No focal neurological deficits. Cranial nerves II through XII are grossly intact.           Labs on Admission:  Basic Metabolic Panel:  Recent Labs Lab 10/28/13 1529  NA 140  K 4.0  CL 103  CO2 28  GLUCOSE 89  BUN 12  CREATININE 0.64  CALCIUM 8.8    Liver Function Tests: No results found for this basename: AST, ALT, ALKPHOS, BILITOT, PROT, ALBUMIN,  in the last 168 hours No results found for this basename: LIPASE, AMYLASE,  in the last 168 hours No results found for this basename: AMMONIA,  in the last 168 hours CBC:  Recent Labs Lab 10/28/13 1529  WBC 6.5  NEUTROABS 3.7  HGB 13.4  HCT 39.9  MCV 100.8*  PLT 198   Cardiac Enzymes: No results found for this basename: CKTOTAL, CKMB, CKMBINDEX, TROPONINI,  in the last 168 hours  BNP (last 3 results) No results found for this basename: PROBNP,  in the last 8760 hours CBG: No results found for this basename: GLUCAP,  in the last 168 hours  Radiological Exams on Admission: Dg Ankle Complete Left  10/28/2013   CLINICAL DATA:  Left ankle pain, redness, swelling.  EXAM: LEFT ANKLE COMPLETE - 3+ VIEW  COMPARISON:  None.  FINDINGS: There is no evidence of fracture, dislocation, or joint effusion. There is no evidence of arthropathy or other focal bone abnormality. Soft tissues are unremarkable.  IMPRESSION: Negative.   Electronically Signed   By: Charlett Nose M.D.   On: 10/28/2013 15:30    Assessment/Plan Present on Admission:  . Cellulitis of left lower leg . Vaginal yeast infection . Cellulitis  #1 cellulitis left lower leg Failed outpatient treatment with doxycycline. Patient given vancomycin in the ED, which we will continue. We'll admit the patient for observation overnight. The cellulitic area was marked.  #2 vaginal yeast infection Diflucan given  #3 hypertension Continue home medications  #4 diarrhea Continue when necessary medications  DVT prophylaxis: Lovenox  Consultants: None  Code Status: Full  Family Communication: Sister, friend   Disposition Plan: Home following improvement on antibiotics  Time spent: 50 minutes Candelaria Celeste, [place] [facility] Pager [phone]  **Disclaimer: This note may have been dictated with voice recognition software.  Similar sounding words can inadvertently be transcribed and this note may contain transcription errors which may not have been corrected upon publication of note.**

## 2013-10-29 DIAGNOSIS — B3731 Acute candidiasis of vulva and vagina: Secondary | ICD-10-CM | POA: Diagnosis present

## 2013-10-29 DIAGNOSIS — Z823 Family history of stroke: Secondary | ICD-10-CM | POA: Diagnosis not present

## 2013-10-29 DIAGNOSIS — M810 Age-related osteoporosis without current pathological fracture: Secondary | ICD-10-CM | POA: Diagnosis present

## 2013-10-29 DIAGNOSIS — M25579 Pain in unspecified ankle and joints of unspecified foot: Secondary | ICD-10-CM | POA: Diagnosis present

## 2013-10-29 DIAGNOSIS — I10 Essential (primary) hypertension: Secondary | ICD-10-CM

## 2013-10-29 DIAGNOSIS — Z882 Allergy status to sulfonamides status: Secondary | ICD-10-CM | POA: Diagnosis not present

## 2013-10-29 DIAGNOSIS — R1319 Other dysphagia: Secondary | ICD-10-CM | POA: Diagnosis present

## 2013-10-29 DIAGNOSIS — L02419 Cutaneous abscess of limb, unspecified: Secondary | ICD-10-CM | POA: Diagnosis present

## 2013-10-29 DIAGNOSIS — Z79899 Other long term (current) drug therapy: Secondary | ICD-10-CM | POA: Diagnosis not present

## 2013-10-29 DIAGNOSIS — R197 Diarrhea, unspecified: Secondary | ICD-10-CM | POA: Diagnosis present

## 2013-10-29 DIAGNOSIS — L03119 Cellulitis of unspecified part of limb: Principal | ICD-10-CM

## 2013-10-29 DIAGNOSIS — B373 Candidiasis of vulva and vagina: Secondary | ICD-10-CM | POA: Diagnosis present

## 2013-10-29 LAB — CBC
HEMATOCRIT: 40.1 % (ref 36.0–46.0)
Hemoglobin: 13.1 g/dL (ref 12.0–15.0)
MCH: 33.3 pg (ref 26.0–34.0)
MCHC: 32.7 g/dL (ref 30.0–36.0)
MCV: 102 fL — AB (ref 78.0–100.0)
PLATELETS: 202 10*3/uL (ref 150–400)
RBC: 3.93 MIL/uL (ref 3.87–5.11)
RDW: 12.8 % (ref 11.5–15.5)
WBC: 4.6 10*3/uL (ref 4.0–10.5)

## 2013-10-29 LAB — BASIC METABOLIC PANEL
Anion gap: 9 (ref 5–15)
BUN: 9 mg/dL (ref 6–23)
CO2: 28 meq/L (ref 19–32)
Calcium: 8.2 mg/dL — ABNORMAL LOW (ref 8.4–10.5)
Chloride: 106 mEq/L (ref 96–112)
Creatinine, Ser: 0.65 mg/dL (ref 0.50–1.10)
GFR calc Af Amer: >90 mL/min (ref >90)
GFR, EST NON AFRICAN AMERICAN: 78 mL/min — AB (ref >90)
GLUCOSE: 96 mg/dL (ref 70–99)
POTASSIUM: 4.4 meq/L (ref 3.7–5.3)
Sodium: 143 mEq/L (ref 137–147)

## 2013-10-29 NOTE — Progress Notes (Signed)
[facility] PROGRESS NOTE  Jessica Bauer ZOX:[id number] DOB: 03/17/1925 DOA: November 25, 2013 PCP: Cassell Smiles., MD  Assessment/Plan: Cellulitis of left lower leg Patient failed outpatient course of antibiotic with doxycycline. Started on IV vancomycin and cellulitis appears much improved this morning with minimal erythema. Would continue vancomycin for today and if slightly improved we'll discharge her home on oral antibiotic.  Hypertension Resume home medications  Chronic diarrhea Continue medications  Vaginal yeast infection Given Diflucan  DVT prophylaxis: Subcutaneous Lovenox  Diet: Regular  Code Status: Full code Family Communication: None at bedside  Disposition Plan: Home in a.m.   Consultants:  None  Procedures:  None  Antibiotics:  IV vancomycin  HPI/Subjective: Patient seen and examined. Reports her left leg erythema and swelling to be much better. Denies any pain  Objective: Filed Vitals:   10/29/13 0658  BP: 113/37  Pulse: 49  Temp: 98 F (36.7 C)  Resp: 20    Intake/Output Summary (Last 24 hours) at 10/29/13 1311 Last data filed at 10/29/13 1307  Gross per 24 hour  Intake    480 ml  Output    600 ml  Net   -120 ml   Filed Weights   Nov 25, 2013 1323 Nov 25, 2013 1804  Weight: 55.339 kg (122 lb) 54.658 kg (120 lb 8 oz)    Exam:   General:  Elderly female in no acute distress  HEENT: No pallor, moist oral mucosa  Chest: Clear to auscultation bilaterally  CVS: Normal S1 and S2, no murmurs  Abdomen: Soft, nontender, nondistended normal bowel sounds present  Extremities: Edema over left lower leg improved  CNS: Alert and oriented   Data Reviewed: Basic Metabolic Panel:  Recent Labs Lab November 25, 2013 1529 10/29/13 0645  NA 140 143  K 4.0 4.4  CL 103 106  CO2 28 28  GLUCOSE 89 96  BUN 12 9  CREATININE 0.64 0.65  CALCIUM 8.8 8.2*   Liver Function Tests: No results found for this basename: AST, ALT, ALKPHOS, BILITOT,  PROT, ALBUMIN,  in the last 168 hours No results found for this basename: LIPASE, AMYLASE,  in the last 168 hours No results found for this basename: AMMONIA,  in the last 168 hours CBC:  Recent Labs Lab 11/25/13 1529 10/29/13 0645  WBC 6.5 4.6  NEUTROABS 3.7  --   HGB 13.4 13.1  HCT 39.9 40.1  MCV 100.8* 102.0*  PLT 198 202   Cardiac Enzymes: No results found for this basename: CKTOTAL, CKMB, CKMBINDEX, TROPONINI,  in the last 168 hours BNP (last 3 results) No results found for this basename: PROBNP,  in the last 8760 hours CBG: No results found for this basename: GLUCAP,  in the last 168 hours  No results found for this or any previous visit (from the past 240 hour(s)).   Studies: Dg Ankle Complete Left  11/25/2013   CLINICAL DATA:  Left ankle pain, redness, swelling.  EXAM: LEFT ANKLE COMPLETE - 3+ VIEW  COMPARISON:  None.  FINDINGS: There is no evidence of fracture, dislocation, or joint effusion. There is no evidence of arthropathy or other focal bone abnormality. Soft tissues are unremarkable.  IMPRESSION: Negative.   Electronically Signed   By: Charlett Nose M.D.   On: 11-25-13 15:30    Scheduled Meds: . atenolol  25 mg Oral Daily  . enoxaparin (LOVENOX) injection  40 mg Subcutaneous Q24H  . pantoprazole  40 mg Oral Daily  . traZODone  50 mg Oral QHS  . vancomycin  500 mg  Intravenous Q24H   Continuous Infusions:     Time spent: 25 minutes    Eddie North  [facility] Pager [phone] If 7PM-7AM, please contact night-coverage at [email or web address], password [facility] 10/29/2013, 1:11 PM  LOS: 1 day

## 2013-10-30 DIAGNOSIS — B3731 Acute candidiasis of vulva and vagina: Secondary | ICD-10-CM

## 2013-10-30 DIAGNOSIS — B373 Candidiasis of vulva and vagina: Secondary | ICD-10-CM

## 2013-10-30 LAB — BASIC METABOLIC PANEL
ANION GAP: 8 (ref 5–15)
BUN: 9 mg/dL (ref 6–23)
CHLORIDE: 108 meq/L (ref 96–112)
CO2: 27 mEq/L (ref 19–32)
CREATININE: 0.64 mg/dL (ref 0.50–1.10)
Calcium: 8.2 mg/dL — ABNORMAL LOW (ref 8.4–10.5)
GFR calc non Af Amer: 79 mL/min — ABNORMAL LOW (ref >90)
Glucose, Bld: 102 mg/dL — ABNORMAL HIGH (ref 70–99)
Potassium: 4.2 mEq/L (ref 3.7–5.3)
Sodium: 143 mEq/L (ref 137–147)

## 2013-10-30 MED ORDER — CEPHALEXIN 500 MG PO CAPS: 500.0000 mg | ORAL_CAPSULE | Freq: Four times a day (QID) | ORAL | Status: AC

## 2013-10-30 NOTE — Discharge Summary (Signed)
Physician Discharge Summary  Jessica Bauer ZOX:[id number] DOB: May 18, 1925 DOA: 10/28/2013  PCP: Cassell Smiles., MD  Admit date: 10/28/2013 Discharge date: 10/30/2013  Time spent: 25  minutes  Recommendations for Outpatient Follow-up:  Follow up with PCP in 1 week  Discharge Diagnoses:   Principle problem   Cellulitis of left lower leg  Active Problems:   Esophageal dysphagia   Vaginal yeast infection   Hypertension   Discharge Condition: fair  Diet recommendation: regular  Filed Weights   10/28/13 1323 10/28/13 1804  Weight: 55.339 kg (122 lb) 54.658 kg (120 lb 8 oz)    History of present illness:  80 y.o. female with Hypertension, osteoporosis, chronic diarrhea came to ED due to nonhealing wounds on her left leg. She was seen by the dermatologist on 10/09/2013 due to a cyst that was removed on her left lateral lower leg, a few centimeters above her lateral malleolus. Additionally, she had a suspicious skin lesion, later found to be benign, removed a few centimeters above her medial malleolus of the same leg at the same visit. She was prophylactically put on doxycycline, with the last dose on the day of admission. However,  The wounds did not heal and over the past several days, there has been increased erythema and swelling to the area.   Hospital Course:  Cellulitis of left lower leg  Patient failed outpatient course of antibiotic with doxycycline. Started on IV vancomycin and cellulitis much improved .  since she failed a completed course of doxycycline and is allergic to bactrim, will discharge her on keflex 500 mg qid to complete a 10 day course of abx.    Hypertension  Resume home medications   Chronic diarrhea  Continue medications   Vaginal yeast infection  Given Diflucan    Diet: Regular   Code Status: Full code   Family Communication: None at bedside   Disposition Plan: Home    Consultants:  None   Procedures:  None   Antibiotics:   IV vancomycin   Discharge Exam: Filed Vitals:   10/30/13 0701  BP: 126/75  Pulse: 60  Temp: 97.8 F (36.6 C)  Resp: 18    General: Elderly female in no acute distress  HEENT: moist oral mucosa  Chest: Clear to auscultation bilaterally  CVS: Normal S1 and S2, no murmurs  Abdomen: Soft, nontender, nondistended normal bowel sounds  present Extremities: erythema over left leg improved , 2 small eschar above lateral and medial malleolus, non tender and no discharge or bleeding.   CNS: Alert and oriented    Discharge Instructions You were cared for by a hospitalist during your hospital stay. If you have any questions about your discharge medications or the care you received while you were in the hospital after you are discharged, you can call the unit and asked to speak with the hospitalist on call if the hospitalist that took care of you is not available. Once you are discharged, your primary care physician will handle any further medical issues. Please note that NO REFILLS for any discharge medications will be authorized once you are discharged, as it is imperative that you return to your primary care physician (or establish a relationship with a primary care physician if you do not have one) for your aftercare needs so that they can reassess your need for medications and monitor your lab values.     Medication List         acetaminophen 500 MG tablet  Commonly known as:  TYLENOL  Take 500 mg by mouth every 6 (six) hours as needed.     atenolol 25 MG tablet  Commonly known as:  TENORMIN  Take 25 mg by mouth daily.     calcium carbonate 600 MG Tabs tablet  Commonly known as:  OS-CAL  Take 600 mg by mouth 2 (two) times daily with a meal.     cephALEXin 500 MG capsule  Commonly known as:  KEFLEX  Take 1 capsule (500 mg total) by mouth 4 (four) times daily. Until 11/06/2013     cholestyramine 4 G packet  Commonly known as:  QUESTRAN  Take 1/2 pack to 1 pack once daily for  diarrhea. Do not take within 2 hours of other medications.     denosumab 60 MG/ML Soln injection  Commonly known as:  PROLIA  Inject 60 mg into the skin every 6 (six) months. Administer in upper arm, thigh, or abdomen     diphenoxylate-atropine 2.5-0.025 MG per tablet  Commonly known as:  LOMOTIL  Take 1 tablet by mouth 4 (four) times daily as needed for diarrhea or loose stools.     glucosamine-chondroitin 500-400 MG tablet  Take 1 tablet by mouth 3 (three) times daily.     loperamide 2 MG capsule  Commonly known as:  IMODIUM  Take 2 mg by mouth 2 (two) times daily as needed for diarrhea or loose stools.     multivitamin capsule  Take 1 capsule by mouth daily.     omeprazole 20 MG capsule  Commonly known as:  PRILOSEC  Take 20 mg by mouth daily.     ramelteon 8 MG tablet  Commonly known as:  ROZEREM  Take 1 tablet (8 mg total) by mouth at bedtime.     traZODone 50 MG tablet  Commonly known as:  DESYREL  Take 50 mg by mouth at bedtime.     UNABLE TO FIND  Kyo-Dophilus probiotics take one daily     vitamin C 1000 MG tablet  Take 1,000 mg by mouth daily.     VITAMIN D (CHOLECALCIFEROL) PO  Take 2,000 Units by mouth daily.       Allergies  Allergen Reactions  . Codeine   . Sulfamethoxazole-Trimethoprim Hives       Follow-up Information   Follow up with Cassell Smiles., MD. Schedule an appointment as soon as possible for a visit in 1 week.   Specialty:  Internal Medicine   Contact information:   [address] [place] [address] [phone]        The results of significant diagnostics from this hospitalization (including imaging, microbiology, ancillary and laboratory) are listed below for reference.    Significant Diagnostic Studies: Dg Ankle Complete Left  29-Oct-2013   CLINICAL DATA:  Left ankle pain, redness, swelling.  EXAM: LEFT ANKLE COMPLETE - 3+ VIEW  COMPARISON:  None.  FINDINGS: There is no evidence of fracture, dislocation, or joint  effusion. There is no evidence of arthropathy or other focal bone abnormality. Soft tissues are unremarkable.  IMPRESSION: Negative.   Electronically Signed   By: Charlett Nose M.D.   On: 2013-10-29 15:30    Microbiology: No results found for this or any previous visit (from the past 240 hour(s)).   Labs: Basic Metabolic Panel:  Recent Labs Lab 10-29-13 1529 10/29/13 0645 10/30/13 0614  NA 140 143 143  K 4.0 4.4 4.2  CL 103 106 108  CO2 GLUCOSE 89 96 102*  BUN  CREATININE 0.64 0.65 0.64  CALCIUM 8.8 8.2* 8.2*   Liver Function Tests: No results found for this basename: AST, ALT, ALKPHOS, BILITOT, PROT, ALBUMIN,  in the last 168 hours No results found for this basename: LIPASE, AMYLASE,  in the last 168 hours No results found for this basename: AMMONIA,  in the last 168 hours CBC:  Recent Labs Lab 10/28/13 1529 10/29/13 0645  WBC 6.5 4.6  NEUTROABS 3.7  --   HGB 13.4 13.1  HCT 39.9 40.1  MCV 100.8* 102.0*  PLT 198 202   Cardiac Enzymes: No results found for this basename: CKTOTAL, CKMB, CKMBINDEX, TROPONINI,  in the last 168 hours BNP: BNP (last 3 results) No results found for this basename: PROBNP,  in the last 8760 hours CBG: No results found for this basename: GLUCAP,  in the last 168 hours     Signed:  Eddie North  [facility] 10/30/2013, 9:57 AM

## 2013-10-30 NOTE — Care Management Note (Signed)
    Page 1 of 1   10/30/2013     10:28:34 AM CARE MANAGEMENT NOTE 10/30/2013  Patient:  Jessica Bauer, Jessica Bauer   Account Number:  [id number]  Date Initiated:  10/30/2013  Documentation initiated by:  CHILDRESS,JESSICA  Subjective/Objective Assessment:   Pt is from home with self care. Pt has sister that lives next door and checks in on her. Pt hs no DME's HH services or medication needs prior to admission.     Action/Plan:   Pt plans to discharge home today with self care. No CM needs identified at this time.   Anticipated DC Date:  10/30/2013   Anticipated DC Plan:  HOME/SELF CARE      DC Planning Services  CM consult      Choice offered to / List presented to:             Status of service:  Completed, signed off Medicare Important Message given?  YES (If response is "NO", the following Medicare IM given date fields will be blank) Date Medicare IM given:  10/30/2013 Medicare IM given by:  Kathyrn Sheriff Date Additional Medicare IM given:   Additional Medicare IM given by:    Discharge Disposition:  HOME/SELF CARE  Per UR Regulation:    If discussed at Long Length of Stay Meetings, dates discussed:    Comments:  10/30/2013 1030 Kathyrn Sheriff, RN, MSN, [facility]

## 2013-10-30 NOTE — Discharge Instructions (Signed)

## 2013-10-30 NOTE — Progress Notes (Signed)
Discharged to home with family.

## 2013-10-31 ENCOUNTER — Telehealth: Payer: Self-pay | Admitting: Internal Medicine

## 2013-10-31 NOTE — Telephone Encounter (Signed)
Lab orders have been faxed to the lab at [facility].

## 2013-10-31 NOTE — Telephone Encounter (Signed)
Pt is going to the lab near [place] to have her blood work done on Friday after she sees Dr Sherwood Gambler. Please make sure the lab orders have been sent there if they aren't already. Thanks!

## 2013-11-04 LAB — IGG, IGA, IGM
IgA: 426 mg/dL — ABNORMAL HIGH (ref 69–380)
IgG (Immunoglobin G), Serum: 854 mg/dL (ref 690–1700)
IgM, Serum: 65 mg/dL (ref 52–322)

## 2013-11-08 ENCOUNTER — Encounter: Payer: Self-pay | Admitting: Internal Medicine

## 2014-01-03 ENCOUNTER — Other Ambulatory Visit (HOSPITAL_COMMUNITY): Payer: Self-pay | Admitting: Internal Medicine

## 2014-01-03 ENCOUNTER — Ambulatory Visit (HOSPITAL_COMMUNITY): Payer: Medicare PPO

## 2014-01-03 DIAGNOSIS — R079 Chest pain, unspecified: Secondary | ICD-10-CM

## 2014-01-04 ENCOUNTER — Ambulatory Visit (HOSPITAL_COMMUNITY)
Admission: RE | Admit: 2014-01-04 | Discharge: 2014-01-04 | Disposition: A | Discharge status: Home / Self Care | Payer: Medicare PPO | Source: Ambulatory Visit | Attending: Internal Medicine | Admitting: Internal Medicine

## 2014-01-04 DIAGNOSIS — W1839XA Other fall on same level, initial encounter: Secondary | ICD-10-CM | POA: Insufficient documentation

## 2014-01-04 DIAGNOSIS — S2231XA Fracture of one rib, right side, initial encounter for closed fracture: Secondary | ICD-10-CM | POA: Diagnosis not present

## 2014-01-04 DIAGNOSIS — J449 Chronic obstructive pulmonary disease, unspecified: Secondary | ICD-10-CM | POA: Diagnosis not present

## 2014-01-04 DIAGNOSIS — S299XXA Unspecified injury of thorax, initial encounter: Secondary | ICD-10-CM | POA: Diagnosis present

## 2014-01-04 DIAGNOSIS — R079 Chest pain, unspecified: Secondary | ICD-10-CM

## 2014-01-16 ENCOUNTER — Emergency Department (HOSPITAL_COMMUNITY)
Admission: EM | Admit: 2014-01-16 | Discharge: 2014-01-16 | Disposition: A | Discharge status: Home / Self Care | Payer: Medicare PPO | Attending: Emergency Medicine | Admitting: Emergency Medicine

## 2014-01-16 ENCOUNTER — Encounter (HOSPITAL_COMMUNITY): Payer: Self-pay | Admitting: Emergency Medicine

## 2014-01-16 DIAGNOSIS — Z79899 Other long term (current) drug therapy: Secondary | ICD-10-CM | POA: Insufficient documentation

## 2014-01-16 DIAGNOSIS — Z792 Long term (current) use of antibiotics: Secondary | ICD-10-CM | POA: Insufficient documentation

## 2014-01-16 DIAGNOSIS — B0222 Postherpetic trigeminal neuralgia: Secondary | ICD-10-CM | POA: Insufficient documentation

## 2014-01-16 DIAGNOSIS — I10 Essential (primary) hypertension: Secondary | ICD-10-CM | POA: Insufficient documentation

## 2014-01-16 DIAGNOSIS — Z87442 Personal history of urinary calculi: Secondary | ICD-10-CM | POA: Diagnosis not present

## 2014-01-16 DIAGNOSIS — W1830XA Fall on same level, unspecified, initial encounter: Secondary | ICD-10-CM | POA: Insufficient documentation

## 2014-01-16 DIAGNOSIS — F329 Major depressive disorder, single episode, unspecified: Secondary | ICD-10-CM | POA: Diagnosis not present

## 2014-01-16 DIAGNOSIS — R21 Rash and other nonspecific skin eruption: Secondary | ICD-10-CM | POA: Diagnosis present

## 2014-01-16 DIAGNOSIS — S2231XG Fracture of one rib, right side, subsequent encounter for fracture with delayed healing: Secondary | ICD-10-CM | POA: Diagnosis not present

## 2014-01-16 DIAGNOSIS — R109 Unspecified abdominal pain: Secondary | ICD-10-CM | POA: Diagnosis not present

## 2014-01-16 DIAGNOSIS — B0229 Other postherpetic nervous system involvement: Secondary | ICD-10-CM

## 2014-01-16 LAB — I-STAT CHEM 8, ED
BUN: 8 mg/dL (ref 6–23)
CHLORIDE: 104 meq/L (ref 96–112)
Calcium, Ion: 1.17 mmol/L (ref 1.13–1.30)
Creatinine, Ser: 0.7 mg/dL (ref 0.50–1.10)
Glucose, Bld: 114 mg/dL — ABNORMAL HIGH (ref 70–99)
HEMATOCRIT: 42 % (ref 36.0–46.0)
Hemoglobin: 14.3 g/dL (ref 12.0–15.0)
POTASSIUM: 3.9 meq/L (ref 3.7–5.3)
Sodium: 139 mEq/L (ref 137–147)
TCO2: 24 mmol/L (ref 0–100)

## 2014-01-16 MED ORDER — SODIUM CHLORIDE 0.9 % IV BOLUS (SEPSIS)
500.0000 mL | Freq: Once | INTRAVENOUS | Status: AC
  Administered 2014-01-16: 500 mL via INTRAVENOUS

## 2014-01-16 MED ORDER — LIDOCAINE 5 % EX PTCH: 1.0000 | MEDICATED_PATCH | CUTANEOUS | Status: AC

## 2014-01-16 MED ORDER — PREGABALIN 50 MG PO CAPS: 50.0000 mg | ORAL_CAPSULE | Freq: Three times a day (TID) | ORAL | Status: DC

## 2014-01-16 MED ORDER — MORPHINE SULFATE 4 MG/ML IJ SOLN
4.0000 mg | Freq: Once | INTRAMUSCULAR | Status: AC
  Administered 2014-01-16: 4 mg via INTRAVENOUS
  Filled 2014-01-16: qty 1

## 2014-01-16 NOTE — ED Provider Notes (Signed)
CSN: [id number]     Arrival date & time 01/16/14  [id number] History   This chart was scribed for Joya Gaskinsonald W Volney Reierson, MD by Tonye RoyaltyJoshua Chen, ED Scribe. This patient was seen in room APA07/APA07 and the patient's care was started at 9:38 AM.    Chief Complaint  Patient presents with  . Pain   The history is provided by the patient. No language interpreter was used.    HPI Comments: Jessica Bauer is a 80 y.o. female who presents to the Emergency Department complaining of shingles pain. She states she had an appointment with Dr. Sherwood GamblerFusco at 0700 today to discuss pain medication for shingles, but was not able to make it there because "it was too dark." She states she has had shingles for 3 weeks to her right side area. She notes that it is no longer visible, but states it is still painful. She also states she fell in the bathroom and cracked her 9th rib on the right side 2 weeks ago. She states she has not run out of pain medication yet; she was prescribed Hydrocodone 4 daily which she has been using along with Tylenol without remission. She also complains of abdominal pain. She denies fever, vomiting, cough, SOB, headache, syncope, or weakness.  Past Medical History  Diagnosis Date  . HTN (hypertension)   . Back pain   . Depression   . Kidney stones    Past Surgical History  Procedure Laterality Date  . Abdominal hysterectomy    . Cholecystectomy  2010  . Back surgery  1995  . Kidney stone surgery  2009/2011  . Esophagogastroduodenoscopy  05/2012    [place]. Schatzki ring, gastritis, hh, pH probe study done, results not available to me  . Esophagogastroduodenoscopy  12/2009    small hh, s/p empiric dilation  . Colonoscopy  12/2009    Dr. Jena Gaussourk: normal s/p segmental bx (benign)   Family History  Problem Relation Age of Onset  . Colon cancer Neg Hx   . Stroke Mother   . Stroke Father    History  Substance Use Topics  . Smoking status: Never Smoker   . Smokeless tobacco: Not on file  .  Alcohol Use: No   OB History    No data available     Review of Systems  Constitutional: Negative for fever.  Respiratory: Negative for cough and shortness of breath.   Gastrointestinal: Positive for abdominal pain. Negative for vomiting.  Musculoskeletal:       Rib area pain  Skin: Positive for rash.  Neurological: Negative for syncope, weakness and headaches.  All other systems reviewed and are negative.     Allergies  Codeine and Sulfamethoxazole-trimethoprim  Home Medications   Prior to Admission medications   Medication Sig Start Date End Date Taking? Authorizing Provider  HYDROcodone-acetaminophen (NORCO/VICODIN) 5-325 MG per tablet Take 1 tablet by mouth 4 (four) times daily.   Yes Historical Provider, MD  traZODone (DESYREL) 50 MG tablet Take 150 mg by mouth at bedtime.    Yes Historical Provider, MD  acetaminophen (TYLENOL) 500 MG tablet Take 500 mg by mouth every 6 (six) hours as needed.    Historical Provider, MD  Ascorbic Acid (VITAMIN C) 1000 MG tablet Take 1,000 mg by mouth daily.    Historical Provider, MD  atenolol (TENORMIN) 25 MG tablet Take 25 mg by mouth daily.    Historical Provider, MD  calcium carbonate (OS-CAL) 600 MG TABS tablet Take 600 mg by mouth 2 (two)  times daily with a meal.    Historical Provider, MD  cephALEXin (KEFLEX) 500 MG capsule Take 1 capsule (500 mg total) by mouth 4 (four) times daily. 10/30/13   Nishant Dhungel, MD  cholestyramine Lanetta Inch(QUESTRAN) 4 G packet Take 1/2 pack to 1 pack once daily for diarrhea. Do not take within 2 hours of other medications. 07/13/13   Tiffany KocherLeslie S Lewis, PA-C  denosumab (PROLIA) 60 MG/ML SOLN injection Inject 60 mg into the skin every 6 (six) months. Administer in upper arm, thigh, or abdomen    Historical Provider, MD  diphenoxylate-atropine (LOMOTIL) 2.5-0.025 MG per tablet Take 1 tablet by mouth 4 (four) times daily as needed for diarrhea or loose stools.    Historical Provider, MD  glucosamine-chondroitin 500-400  MG tablet Take 1 tablet by mouth 3 (three) times daily.    Historical Provider, MD  lidocaine (LIDODERM) 5 % Place 1 patch onto the skin daily. Remove & Discard patch within 12 hours or as directed by MD 01/16/14   Joya Gaskinsonald W Caiya Bettes, MD  loperamide (IMODIUM) 2 MG capsule Take 2 mg by mouth 2 (two) times daily as needed for diarrhea or loose stools.    Historical Provider, MD  Multiple Vitamin (MULTIVITAMIN) capsule Take 1 capsule by mouth daily.    Historical Provider, MD  omeprazole (PRILOSEC) 20 MG capsule Take 20 mg by mouth daily.    Historical Provider, MD  pregabalin (LYRICA) 50 MG capsule Take 1 capsule (50 mg total) by mouth 3 (three) times daily. 01/16/14   Joya Gaskinsonald W Philicia Heyne, MD  ramelteon (ROZEREM) 8 MG tablet Take 1 tablet (8 mg total) by mouth at bedtime. 07/13/13   Tiffany KocherLeslie S Lewis, PA-C  UNABLE TO FIND Kyo-Dophilus probiotics take one daily    Historical Provider, MD  VITAMIN D, CHOLECALCIFEROL, PO Take 2,000 Units by mouth daily.    Historical Provider, MD   BP 138/78 mmHg  Pulse 72  Temp(Src) 98.3 F (36.8 C) (Oral)  Resp 18  Ht 5\' 1"  (1.549 m)  Wt 120 lb (54.432 kg)  BMI 22.69 kg/m2  SpO2 95% Physical Exam  Nursing note and vitals reviewed.  CONSTITUTIONAL: Well developed/well nourished HEAD: Normocephalic/atraumatic EYES: EOMI/PERRL ENMT: Mucous membranes dry NECK: supple no meningeal signs SPINE/BACK:entire spine nontender CV: S1/S2 noted, no murmurs/rubs/gallops noted CHEST: no chest wall tenderness or crepitus LUNGS: Lungs are clear to auscultation bilaterally, no apparent distress ABDOMEN: soft, nontender, no rebound or guarding, bowel sounds noted throughout abdomen GU:no cva tenderness NEURO: Pt is awake/alert/appropriate, moves all extremitiesx4.  No facial droop.   EXTREMITIES: pulses normal/equal, full ROM SKIN: warm, color norma, no rlash appreciated PSYCH: no abnormalities of mood noted, alert and oriented to situation  ED Course  Procedures    DIAGNOSTIC STUDIES: Oxygen Saturation is 95% on room air, adequate by my interpretation.    COORDINATION OF CARE: 9:44 AM Discussed treatment plan with patient at beside, the patient agrees with the plan and has no further questions at this time.   11:06 AM I spoke to Dr Sherwood GamblerFusco her PCP He recommends for post herpetic neuralgia, stop gabapentin, start lyrica and try lidoderm patch Pt already has hydrocodone at home Pt appropriate and stable for d/c home  Labs Review Labs Reviewed  I-STAT CHEM 8, ED - Abnormal; Notable for the following:    Glucose, Bld 114 (*)    All other components within normal limits    Medications  morphine 4 MG/ML injection 4 mg (4 mg Intravenous Given 01/16/14 0957)  sodium  chloride 0.9 % bolus 500 mL (500 mLs Intravenous New Bag/Given 01/16/14 0956)       MDM   Final diagnoses:  Post herpetic neuralgia    Nursing notes including past medical history and social history reviewed and considered in documentation Labs/vital reviewed myself and considered during evaluation   I personally performed the services described in this documentation, which was scribed in my presence. The recorded information has been reviewed and is accurate.      Joya Gaskins, MD 01/16/14 [phone]

## 2014-01-16 NOTE — ED Notes (Signed)
Pt inquired about anxiety med px. EDP aware and reported for pt to follow-up with PCP. Pt aware and verbalized understanding.

## 2014-01-16 NOTE — Discharge Instructions (Signed)
Postherpetic Neuralgia Postherpetic neuralgia (PHN) is nerve pain that occurs after a shingles infection. Shingles is a painful rash that appears on one side of the body, usually on your trunk or face. Shingles is caused by the varicella-zoster virus. This is the same virus that causes chickenpox. In people who have had chickenpox, the virus can resurface years later and cause shingles. You may have PHN if you continue to have pain for 3 months after your shingles rash has gone away. PHN appears in the same area where you had the shingles rash. For most people, PHN goes away within 1 year.  Getting a vaccination for shingles can prevent PHN. This vaccine is recommended for people older than 50. It may prevent shingles and may also lower your risk of PHN if you do get shingles. CAUSES PHN is caused by damage to your nerves from the varicella-zoster virus. This damage makes your nerves overly sensitive.  RISK FACTORS Aging is the biggest risk factor for developing PHN. Most people who get PHN are older than 60. Other risk factors include:  Having very bad pain before your shingles rash starts.  Having a very bad rash.  Having shingles in the nerve that supplies your face and eye (trigeminal nerve). SIGNS AND SYMPTOMS Pain is the main symptom of PHN. The pain is often very bad and may be described as stabbing, burning, or feeling like an electric shock. The pain may come and go or may be there all the time. Pain may be triggered by light touches on the skin or changes in temperature. You may have itching along with the pain. DIAGNOSIS  Your health care provider may diagnose PHN based on your symptoms and your history of shingles. Lab studies and other diagnostic tests are usually not needed. TREATMENT  There is no cure for PHN. Treatment for PHN will focus on pain relief. Over-the-counter pain relievers do not usually relieve PHN pain. You may need to work with a pain specialist. Treatment may  include:  Antidepressant medicines to help with pain and improve sleep.  Antiseizure medicines to relieve nerve pain.  Strong pain relievers (opioids).  A numbing patch worn on the skin (lidocaine patch). HOME CARE INSTRUCTIONS It may take a long time to recover from PHN. Work closely with your health care provider, and have a good support system at home.   Take all medicines as directed by your health care provider.  Wear loose, comfortable clothing.  Cover sensitive areas with a dressing to reduce friction from clothing rubbing on the area.  If cold does not make your pain worse, try applying a cool compress or cooling gel pack to the area.  Talk to your health care provider if you feel depressed or desperate. Living with long-term pain can be depressing. SEEK MEDICAL CARE IF:  Your medicine is not helping.  You are struggling to manage your pain at home. Document Released: 05/09/2002 Document Revised: 07/03/2013 Document Reviewed: 02/07/2013 Exit[employer] Patient Information 2015 [employer]. This information is not intended to replace advice given to you by your health care provider. Make sure you discuss any questions you have with your health care provider.  

## 2014-01-16 NOTE — ED Notes (Signed)
EDP at bedside  

## 2014-01-16 NOTE — ED Notes (Signed)
Pt brought in EMS for pain control. Pt reports was diagnosed with shingles x3 weeks, fell and injured right rib cage x2 weeks ago. Pt reports followed up with pcp for same and reports "i need some more pain medications to live." nad noted.

## 2014-01-22 NOTE — ED Notes (Signed)
Phone call from Child psychotherapistsocial worker. Patient could not afford "pain patch".   I do not feel comfortable prescribing pain medicine over the phone to an [age] year old patient. Recommended return to her primary care doctor, Dr. Heriberto AntiguaFusco  Rosland Riding, MD 01/22/14 1440

## 2014-01-22 NOTE — Progress Notes (Signed)
  CARE MANAGEMENT ED NOTE 01/22/2014  Patient:  Jessica Bauer,Jessica Bauer   Account Number:  [id number]  Date Initiated:  01/22/2014  Documentation initiated by:  Edd ArbourGIBBS,Jahmiyah Dullea  Subjective/Objective Assessment:   80 yr old [place] choice ppo pt seen at [facility] on 01/16/14 for rib pain and shingle pain.  Pt reports being rx pain patch but cost per insurance is $260 Pt can afford but inquired if she should pay and get it     Subjective/Objective Assessment Detail:   pcp lawrence fusco  Dr Adriana Simasook recommends pt continue lyrica treatment, not to fill pain patch Rx and if it does not help pt is to call her pcp for further po pain meds vs pain patch Prefers not to order pt more pain medications and voiced concern with pt taken ordered patch related to her age  Pt voiced appreciation of recommendation from Dr Adriana Simasook and states she will call pcp and cm prn     Action/Plan:   ED Cm was transferred call from pt to CM mobile number CM returned call to pt at 1436; [facility] & [facility]; Cm returned call to pt at 1441 Provided Dr Adriana Simasook recommendations to pt and provided CM office #   Action/Plan Detail:   Anticipated DC Date:  01/16/2014     Status Recommendation to Physician:   Result of Recommendation:    Other ED Services  Consult Working Plan    DC Planning Services  Other  Outpatient Services - Pt will follow up    Choice offered to / List presented to:            Status of service:  Completed, signed off  ED Comments:   ED Comments Detail:

## 2014-01-23 NOTE — Progress Notes (Signed)
Quick Note:  Please let patient know I would recommend serum electropheresis for abnormal IgA level, do in 8 weeks given recent Shingles. ______

## 2014-01-27 ENCOUNTER — Encounter (HOSPITAL_COMMUNITY): Payer: Self-pay | Admitting: *Deleted

## 2014-01-27 ENCOUNTER — Emergency Department (HOSPITAL_COMMUNITY)
Admission: EM | Admit: 2014-01-27 | Discharge: 2014-01-27 | Disposition: A | Discharge status: Home / Self Care | Payer: Medicare PPO | Attending: Emergency Medicine | Admitting: Emergency Medicine

## 2014-01-27 DIAGNOSIS — Z8619 Personal history of other infectious and parasitic diseases: Secondary | ICD-10-CM | POA: Insufficient documentation

## 2014-01-27 DIAGNOSIS — Z87448 Personal history of other diseases of urinary system: Secondary | ICD-10-CM | POA: Insufficient documentation

## 2014-01-27 DIAGNOSIS — M79621 Pain in right upper arm: Secondary | ICD-10-CM | POA: Insufficient documentation

## 2014-01-27 DIAGNOSIS — R079 Chest pain, unspecified: Secondary | ICD-10-CM | POA: Diagnosis not present

## 2014-01-27 DIAGNOSIS — I10 Essential (primary) hypertension: Secondary | ICD-10-CM | POA: Insufficient documentation

## 2014-01-27 DIAGNOSIS — F329 Major depressive disorder, single episode, unspecified: Secondary | ICD-10-CM | POA: Insufficient documentation

## 2014-01-27 DIAGNOSIS — B0229 Other postherpetic nervous system involvement: Secondary | ICD-10-CM

## 2014-01-27 DIAGNOSIS — Z792 Long term (current) use of antibiotics: Secondary | ICD-10-CM | POA: Diagnosis not present

## 2014-01-27 DIAGNOSIS — Z79899 Other long term (current) drug therapy: Secondary | ICD-10-CM | POA: Diagnosis not present

## 2014-01-27 MED ORDER — PREGABALIN 50 MG PO CAPS
50.0000 mg | ORAL_CAPSULE | Freq: Once | ORAL | Status: AC
  Administered 2014-01-27: 50 mg via ORAL
  Filled 2014-01-27: qty 1

## 2014-01-27 MED ORDER — KETOROLAC TROMETHAMINE 30 MG/ML IJ SOLN
30.0000 mg | Freq: Once | INTRAMUSCULAR | Status: AC
  Administered 2014-01-27: 30 mg via INTRAVENOUS
  Filled 2014-01-27: qty 1

## 2014-01-27 MED ORDER — PREGABALIN 50 MG PO CAPS: 50.0000 mg | ORAL_CAPSULE | Freq: Three times a day (TID) | ORAL | Status: AC

## 2014-01-27 NOTE — Discharge Instructions (Signed)
Postherpetic Neuralgia Postherpetic neuralgia (PHN) is nerve pain that occurs after a shingles infection. Shingles is a painful rash that appears on one side of the body, usually on your trunk or face. Shingles is caused by the varicella-zoster virus. This is the same virus that causes chickenpox. In people who have had chickenpox, the virus can resurface years later and cause shingles. You may have PHN if you continue to have pain for 3 months after your shingles rash has gone away. PHN appears in the same area where you had the shingles rash. For most people, PHN goes away within 1 year.  Getting a vaccination for shingles can prevent PHN. This vaccine is recommended for people older than 50. It may prevent shingles and may also lower your risk of PHN if you do get shingles. CAUSES PHN is caused by damage to your nerves from the varicella-zoster virus. This damage makes your nerves overly sensitive.  RISK FACTORS Aging is the biggest risk factor for developing PHN. Most people who get PHN are older than 60. Other risk factors include:  Having very bad pain before your shingles rash starts.  Having a very bad rash.  Having shingles in the nerve that supplies your face and eye (trigeminal nerve). SIGNS AND SYMPTOMS Pain is the main symptom of PHN. The pain is often very bad and may be described as stabbing, burning, or feeling like an electric shock. The pain may come and go or may be there all the time. Pain may be triggered by light touches on the skin or changes in temperature. You may have itching along with the pain. DIAGNOSIS  Your health care provider may diagnose PHN based on your symptoms and your history of shingles. Lab studies and other diagnostic tests are usually not needed. TREATMENT  There is no cure for PHN. Treatment for PHN will focus on pain relief. Over-the-counter pain relievers do not usually relieve PHN pain. You may need to work with a pain specialist. Treatment may  include:  Antidepressant medicines to help with pain and improve sleep.  Antiseizure medicines to relieve nerve pain.  Strong pain relievers (opioids).  A numbing patch worn on the skin (lidocaine patch). HOME CARE INSTRUCTIONS It may take a long time to recover from PHN. Work closely with your health care provider, and have a good support system at home.   Take all medicines as directed by your health care provider.  Wear loose, comfortable clothing.  Cover sensitive areas with a dressing to reduce friction from clothing rubbing on the area.  If cold does not make your pain worse, try applying a cool compress or cooling gel pack to the area.  Talk to your health care provider if you feel depressed or desperate. Living with long-term pain can be depressing. SEEK MEDICAL CARE IF:  Your medicine is not helping.  You are struggling to manage your pain at home. Document Released: 05/09/2002 Document Revised: 07/03/2013 Document Reviewed: 02/07/2013 Exit[employer] Patient Information 2015 [employer]. This information is not intended to replace advice given to you by your health care provider. Make sure you discuss any questions you have with your health care provider.  

## 2014-01-27 NOTE — ED Notes (Signed)
Pt states she was diagnosed 5 weeks ago with shingles. The shingles to right side under arm appear dried up, but pt very sensitive to touch. States her pain is 10/10 on scale. Taken hydrocodone around 2pm today and lyrica this morning. With minimal relief. States no refills on lyrica due to obtaining script from EDP originally. Cant see PCP til Monday.

## 2014-01-27 NOTE — ED Notes (Signed)
Discharge instructions given, pt demonstrated teach back and verbal understanding. No concerns voiced.  

## 2014-01-27 NOTE — ED Provider Notes (Signed)
CSN: [id number]     Arrival date & time 01/27/14  1944 History  This chart was scribed for Flint MelterElliott L Kassadi Presswood, MD by Evon Slackerrance Branch, ED Scribe. This patient was seen in room APA19/APA19 and the patient's care was started at 8:05 PM.     Chief Complaint  Patient presents with  . Herpes Zoster   The history is provided by the patient. No language interpreter was used.   HPI Comments: Jessica Bauer is a 80 y.o. female who presents to the Emergency Department complaining of right sided chest and right upper arm shingle pain. She states she recently ran out of Lyrica which had been providing some relief. She states she has been taking hydrocodone with no relief. She states that she thinks that the hydrocodone is making her confused. She states she also has a fracture of the 9th rib on the right side. Denies fever, SOB, or rash. She states she had the shingles vaccination that was not effective.    Past Medical History  Diagnosis Date  . HTN (hypertension)   . Back pain   . Depression   . Kidney stones    Past Surgical History  Procedure Laterality Date  . Abdominal hysterectomy    . Cholecystectomy  2010  . Back surgery  1995  . Kidney stone surgery  2009/2011  . Esophagogastroduodenoscopy  05/2012    [place]. Schatzki ring, gastritis, hh, pH probe study done, results not available to me  . Esophagogastroduodenoscopy  12/2009    small hh, s/p empiric dilation  . Colonoscopy  12/2009    Dr. Jena Gaussourk: normal s/p segmental bx (benign)   Family History  Problem Relation Age of Onset  . Colon cancer Neg Hx   . Stroke Mother   . Stroke Father    History  Substance Use Topics  . Smoking status: Never Smoker   . Smokeless tobacco: Not on file  . Alcohol Use: No   OB History    No data available     Review of Systems  Constitutional: Negative for fever.  Respiratory: Negative for shortness of breath.   Skin: Negative for rash.  All other systems reviewed and are  negative.   Allergies  Codeine and Sulfamethoxazole-trimethoprim  Home Medications   Prior to Admission medications   Medication Sig Start Date End Date Taking? Authorizing Provider  acetaminophen (TYLENOL) 500 MG tablet Take 500 mg by mouth every 6 (six) hours as needed.    Historical Provider, MD  Ascorbic Acid (VITAMIN C) 1000 MG tablet Take 1,000 mg by mouth daily.    Historical Provider, MD  atenolol (TENORMIN) 25 MG tablet Take 25 mg by mouth daily.    Historical Provider, MD  calcium carbonate (OS-CAL) 600 MG TABS tablet Take 600 mg by mouth 2 (two) times daily with a meal.    Historical Provider, MD  cephALEXin (KEFLEX) 500 MG capsule Take 1 capsule (500 mg total) by mouth 4 (four) times daily. 10/30/13   Nishant Dhungel, MD  cholestyramine Lanetta Inch(QUESTRAN) 4 G packet Take 1/2 pack to 1 pack once daily for diarrhea. Do not take within 2 hours of other medications. 07/13/13   Tiffany KocherLeslie S Lewis, PA-C  denosumab (PROLIA) 60 MG/ML SOLN injection Inject 60 mg into the skin every 6 (six) months. Administer in upper arm, thigh, or abdomen    Historical Provider, MD  diphenoxylate-atropine (LOMOTIL) 2.5-0.025 MG per tablet Take 1 tablet by mouth 4 (four) times daily as needed for diarrhea or loose stools.  Historical Provider, MD  glucosamine-chondroitin 500-400 MG tablet Take 1 tablet by mouth 3 (three) times daily.    Historical Provider, MD  HYDROcodone-acetaminophen (NORCO/VICODIN) 5-325 MG per tablet Take 1 tablet by mouth 4 (four) times daily.    Historical Provider, MD  lidocaine (LIDODERM) 5 % Place 1 patch onto the skin daily. Remove & Discard patch within 12 hours or as directed by MD 01/16/14   Joya Gaskinsonald W Wickline, MD  loperamide (IMODIUM) 2 MG capsule Take 2 mg by mouth 2 (two) times daily as needed for diarrhea or loose stools.    Historical Provider, MD  Multiple Vitamin (MULTIVITAMIN) capsule Take 1 capsule by mouth daily.    Historical Provider, MD  omeprazole (PRILOSEC) 20 MG capsule  Take 20 mg by mouth daily.    Historical Provider, MD  pregabalin (LYRICA) 50 MG capsule Take 1 capsule (50 mg total) by mouth 3 (three) times daily. 01/16/14   Joya Gaskinsonald W Wickline, MD  ramelteon (ROZEREM) 8 MG tablet Take 1 tablet (8 mg total) by mouth at bedtime. 07/13/13   Tiffany KocherLeslie S Lewis, PA-C  traZODone (DESYREL) 50 MG tablet Take 150 mg by mouth at bedtime.     Historical Provider, MD  UNABLE TO FIND Kyo-Dophilus probiotics take one daily    Historical Provider, MD  VITAMIN D, CHOLECALCIFEROL, PO Take 2,000 Units by mouth daily.    Historical Provider, MD   Triage Vitals: BP 167/96 mmHg  Pulse 76  Temp(Src) 97.9 F (36.6 C) (Oral)  Resp 20  Ht 5\' 1"  (1.549 m)  Wt 120 lb (54.432 kg)  BMI 22.69 kg/m2  SpO2 97%  Physical Exam  Constitutional: She is oriented to person, place, and time. She appears well-developed and well-nourished.  HENT:  Head: Normocephalic and atraumatic.  Eyes: Conjunctivae and EOM are normal. Pupils are equal, round, and reactive to light.  Neck: Normal range of motion and phonation normal. Neck supple.  Cardiovascular: Normal rate and regular rhythm.   Pulmonary/Chest: Effort normal and breath sounds normal. She exhibits no tenderness.  Abdominal: Soft. She exhibits no distension. There is no tenderness. There is no guarding.  Musculoskeletal: Normal range of motion.  Neurological: She is alert and oriented to person, place, and time. She exhibits normal muscle tone.  Skin: Skin is warm and dry.  Psychiatric: She has a normal mood and affect. Her behavior is normal. Judgment and thought content normal.  Nursing note and vitals reviewed.   ED Course  Procedures (including critical care time) DIAGNOSTIC STUDIES: Oxygen Saturation is 97% on RA, normal by my interpretation.    COORDINATION OF CARE: 8:24 PM-Discussed treatment plan which includes Toradol injection and lyrica with pt at bedside and pt agreed to plan.   Medications  pregabalin (LYRICA) capsule  50 mg (50 mg Oral Given 01/27/14 2030)  ketorolac (TORADOL) 30 MG/ML injection 30 mg (30 mg Intravenous Given 01/27/14 2030)    Patient Vitals for the past 24 hrs:  BP Temp Temp src Pulse Resp SpO2 Height Weight  01/27/14 1952 167/96 mmHg 97.9 F (36.6 C) Oral 76 20 97 % 5\' 1"  (1.549 m) 120 lb (54.432 kg)    10:25 PM Reevaluation with update and discussion. After initial assessment and treatment, an updated evaluation reveals she is more comfortable at this time.  Findings discussed with patient, all questions answered.Mancel Bale. Gayland Nicol L    Labs Review Labs Reviewed - No data to display  Imaging Review No results found.   EKG Interpretation None  MDM   Final diagnoses:  Post herpetic neuralgia    Pain related to episode of shingles, consistent with postherpetic neuralgia.  No evidence for skin infection, pneumonia, or suspected serious bacterial infection.  Nursing Notes Reviewed/ Care Coordinated Applicable Imaging Reviewed Interpretation of Laboratory Data incorporated into ED treatment  The patient appears reasonably screened and/or stabilized for discharge and I doubt any other medical condition or other [facility] requiring further screening, evaluation, or treatment in the ED at this time prior to discharge.  Plan: Home Medications- Lyrica; Home Treatments- rest; return here if the recommended treatment, does not improve the symptoms; Recommended follow up- PCP prn   I personally performed the services described in this documentation, which was scribed in my presence. The recorded information has been reviewed and is accurate.       Flint Melter, MD 01/27/14 2227

## 2014-01-27 NOTE — ED Notes (Signed)
Pt c/o pain associated with her shingles, pt states she was diagnosed x 5 weeks ago and pt has a cracked rib to right side

## 2014-02-07 ENCOUNTER — Other Ambulatory Visit: Payer: Self-pay | Admitting: Gastroenterology

## 2014-02-07 DIAGNOSIS — R899 Unspecified abnormal finding in specimens from other organs, systems and tissues: Secondary | ICD-10-CM

## 2014-02-27 ENCOUNTER — Other Ambulatory Visit (HOSPITAL_COMMUNITY): Payer: Medicare PPO

## 2014-02-27 ENCOUNTER — Encounter (HOSPITAL_COMMUNITY)
Admission: RE | Admit: 2014-02-27 | Discharge: 2014-02-27 | Disposition: A | Discharge status: Home / Self Care | Payer: Medicare PPO | Source: Ambulatory Visit | Attending: Internal Medicine | Admitting: Internal Medicine

## 2014-03-26 ENCOUNTER — Other Ambulatory Visit: Payer: Self-pay

## 2014-03-26 DIAGNOSIS — R899 Unspecified abnormal finding in specimens from other organs, systems and tissues: Secondary | ICD-10-CM

## 2014-04-12 ENCOUNTER — Other Ambulatory Visit (HOSPITAL_COMMUNITY): Payer: Self-pay | Admitting: Internal Medicine

## 2014-04-12 ENCOUNTER — Ambulatory Visit (HOSPITAL_COMMUNITY)
Admission: RE | Admit: 2014-04-12 | Discharge: 2014-04-12 | Disposition: A | Discharge status: Home / Self Care | Payer: Medicare PPO | Source: Ambulatory Visit | Attending: Internal Medicine | Admitting: Internal Medicine

## 2014-04-12 DIAGNOSIS — R0781 Pleurodynia: Secondary | ICD-10-CM | POA: Insufficient documentation

## 2014-04-12 DIAGNOSIS — Y92019 Unspecified place in single-family (private) house as the place of occurrence of the external cause: Secondary | ICD-10-CM | POA: Diagnosis not present

## 2014-04-12 DIAGNOSIS — T1490 Injury, unspecified: Secondary | ICD-10-CM | POA: Insufficient documentation

## 2014-04-12 DIAGNOSIS — R079 Chest pain, unspecified: Secondary | ICD-10-CM

## 2014-04-12 DIAGNOSIS — R937 Abnormal findings on diagnostic imaging of other parts of musculoskeletal system: Secondary | ICD-10-CM | POA: Diagnosis not present

## 2014-04-12 DIAGNOSIS — W19XXXA Unspecified fall, initial encounter: Secondary | ICD-10-CM | POA: Insufficient documentation

## 2014-04-23 ENCOUNTER — Telehealth: Payer: Self-pay

## 2014-04-23 NOTE — Telephone Encounter (Signed)
I'm not sure I follow the question about the dairy. Please elaborate. This lady hasn't had any recent labs. Last ones in 10/2013. Await new labs being done today.

## 2014-04-23 NOTE — Telephone Encounter (Signed)
Pt called about her lab work. She is wanting to know if dairy could be causing her blood work. She is trying to get everything together before she goes back home([place]). She is going to have her blood work done today. Pease advise

## 2014-04-25 LAB — PROTEIN ELECTROPHORESIS, SERUM
Albumin ELP: 56.1 % (ref 55.8–66.1)
Alpha-1-Globulin: 4.4 % (ref 2.9–4.9)
Alpha-2-Globulin: 10.9 % (ref 7.1–11.8)
Beta 2: 6.4 % (ref 3.2–6.5)
Beta Globulin: 6.5 % (ref 4.7–7.2)
Gamma Globulin: 15.7 % (ref 11.1–18.8)
Total Protein, Serum Electrophoresis: 6.4 g/dL (ref 6.0–8.3)

## 2014-04-29 NOTE — Progress Notes (Signed)
Quick Note:  Please let patient know her lab is normal. Her IgA returned to normal. May have been related to Shingles previously. She was supposed to see Dr. Jena Gaussourk in 11/2013 but looks like appointment was never made. If she is still having any problems, offer her OV with RMR only. ______

## 2014-04-29 NOTE — Telephone Encounter (Signed)
See result note.  

## 2014-04-30 NOTE — Telephone Encounter (Signed)
Dairy can contribute to diarrhea especially if lactose intolerant, usually people have lifelong symptoms.  If she has any further issues with diarrhea, weight loss, would recommend OV. Last seen in 08/2013.

## 2014-04-30 NOTE — Telephone Encounter (Signed)
She was wanting to know id the dairy could be causing her diarrhea. But she is feeling better now.

## 2014-04-30 NOTE — Telephone Encounter (Signed)
Pt is aware.  

## 2014-04-30 NOTE — Telephone Encounter (Signed)
Routing to Ginger.  

## 2014-05-01 DIAGNOSIS — H2513 Age-related nuclear cataract, bilateral: Secondary | ICD-10-CM | POA: Diagnosis not present

## 2014-05-07 MED ORDER — DENOSUMAB 60 MG/ML ~~LOC~~ SOLN: 60.0000 mg | Freq: Once | SUBCUTANEOUS | Status: DC

## 2014-05-08 ENCOUNTER — Encounter (HOSPITAL_COMMUNITY)
Admission: RE | Admit: 2014-05-08 | Discharge: 2014-05-08 | Disposition: A | Discharge status: Home / Self Care | Payer: Medicare PPO | Source: Ambulatory Visit | Attending: Internal Medicine | Admitting: Internal Medicine

## 2014-05-08 ENCOUNTER — Ambulatory Visit (HOSPITAL_COMMUNITY): Payer: Medicare PPO

## 2014-05-08 DIAGNOSIS — M810 Age-related osteoporosis without current pathological fracture: Secondary | ICD-10-CM | POA: Diagnosis present

## 2014-05-08 LAB — COMPREHENSIVE METABOLIC PANEL
ALT: 21 U/L (ref 0–35)
ANION GAP: 5 (ref 5–15)
AST: 24 U/L (ref 0–37)
Albumin: 3.7 g/dL (ref 3.5–5.2)
Alkaline Phosphatase: 51 U/L (ref 39–117)
BILIRUBIN TOTAL: 0.4 mg/dL (ref 0.3–1.2)
BUN: 14 mg/dL (ref 6–23)
CHLORIDE: 104 mmol/L (ref 96–112)
CO2: 31 mmol/L (ref 19–32)
Calcium: 8.9 mg/dL (ref 8.4–10.5)
Creatinine, Ser: 0.67 mg/dL (ref 0.50–1.10)
GFR calc Af Amer: 90 mL/min — ABNORMAL LOW (ref >90)
GFR, EST NON AFRICAN AMERICAN: 77 mL/min — AB (ref >90)
Glucose, Bld: 92 mg/dL (ref 70–99)
Potassium: 4.3 mmol/L (ref 3.5–5.1)
Sodium: 140 mmol/L (ref 135–145)
Total Protein: 6.8 g/dL (ref 6.0–8.3)

## 2014-05-08 MED ORDER — DENOSUMAB 60 MG/ML ~~LOC~~ SOLN
60.0000 mg | Freq: Once | SUBCUTANEOUS | Status: DC
Start: 2014-05-08 — End: 2014-05-09
  Filled 2014-05-08: qty 1

## 2014-05-08 MED ORDER — DENOSUMAB 60 MG/ML ~~LOC~~ SOLN
60.0000 mg | Freq: Once | SUBCUTANEOUS | Status: AC
  Administered 2014-05-08: 60 mg via SUBCUTANEOUS
  Filled 2014-05-08: qty 1

## 2014-05-08 NOTE — Progress Notes (Signed)
Prolia 60Results for Guido SanderKERNODLE, Jersee L (MRN [id number]) as of 05/08/2014 14:15  Ref. Range 05/08/2014 11:55  Sodium Latest Range: 135-145 mmol/L 140  Potassium Latest Range: 3.5-5.1 mmol/L 4.3  Chloride Latest Range: 96-112 mmol/L 104  CO2 Latest Range: 19-32 mmol/L 31  BUN Latest Range: 6-23 mg/dL 14  Creatinine Latest Range: 0.50-1.10 mg/dL 4.090.67  Calcium Latest Range: 8.4-10.5 mg/dL 8.9  GFR calc non Af Amer Latest Range: >90 mL/min 77 (L)  GFR calc Af Amer Latest Range: >90 mL/min 90 (L)  Glucose Latest Range: 70-99 mg/dL 92  Anion gap Latest Range: 5-15  5  Alkaline Phosphatase Latest Range: 39-117 U/L 51  Albumin Latest Range: 3.5-5.2 g/dL 3.7  AST Latest Range: 0-37 U/L 24  ALT Latest Range: 0-35 U/L 21  Total Protein Latest Range: 6.0-8.3 g/dL 6.8  Total Bilirubin Latest Range: 0.3-1.2 mg/dL 0.4  mg SQ given this visit

## 2014-08-16 DIAGNOSIS — N390 Urinary tract infection, site not specified: Secondary | ICD-10-CM | POA: Diagnosis not present

## 2014-08-16 DIAGNOSIS — E782 Mixed hyperlipidemia: Secondary | ICD-10-CM | POA: Diagnosis not present

## 2014-08-16 DIAGNOSIS — Z6821 Body mass index (BMI) 21.0-21.9, adult: Secondary | ICD-10-CM | POA: Diagnosis not present

## 2014-08-16 DIAGNOSIS — I10 Essential (primary) hypertension: Secondary | ICD-10-CM | POA: Diagnosis not present

## 2014-08-17 ENCOUNTER — Other Ambulatory Visit (HOSPITAL_COMMUNITY): Payer: Self-pay | Admitting: Internal Medicine

## 2014-08-17 DIAGNOSIS — G459 Transient cerebral ischemic attack, unspecified: Secondary | ICD-10-CM

## 2014-08-17 DIAGNOSIS — Z1231 Encounter for screening mammogram for malignant neoplasm of breast: Secondary | ICD-10-CM

## 2014-08-17 DIAGNOSIS — M810 Age-related osteoporosis without current pathological fracture: Secondary | ICD-10-CM

## 2014-08-23 ENCOUNTER — Ambulatory Visit (HOSPITAL_COMMUNITY)
Admission: RE | Admit: 2014-08-23 | Discharge: 2014-08-23 | Disposition: A | Discharge status: Home / Self Care | Payer: Medicare PPO | Source: Ambulatory Visit | Attending: Internal Medicine | Admitting: Internal Medicine

## 2014-08-23 DIAGNOSIS — Z780 Asymptomatic menopausal state: Secondary | ICD-10-CM | POA: Diagnosis not present

## 2014-08-23 DIAGNOSIS — M810 Age-related osteoporosis without current pathological fracture: Secondary | ICD-10-CM | POA: Insufficient documentation

## 2014-08-23 DIAGNOSIS — Z1231 Encounter for screening mammogram for malignant neoplasm of breast: Secondary | ICD-10-CM | POA: Insufficient documentation

## 2014-08-23 DIAGNOSIS — R531 Weakness: Secondary | ICD-10-CM | POA: Insufficient documentation

## 2014-08-23 DIAGNOSIS — G459 Transient cerebral ischemic attack, unspecified: Secondary | ICD-10-CM

## 2014-08-23 DIAGNOSIS — I6523 Occlusion and stenosis of bilateral carotid arteries: Secondary | ICD-10-CM | POA: Diagnosis not present

## 2014-08-24 ENCOUNTER — Other Ambulatory Visit (HOSPITAL_COMMUNITY): Payer: Medicare PPO

## 2014-08-28 DIAGNOSIS — Z6821 Body mass index (BMI) 21.0-21.9, adult: Secondary | ICD-10-CM | POA: Diagnosis not present

## 2014-08-28 DIAGNOSIS — N342 Other urethritis: Secondary | ICD-10-CM | POA: Diagnosis not present

## 2014-08-28 DIAGNOSIS — F329 Major depressive disorder, single episode, unspecified: Secondary | ICD-10-CM | POA: Diagnosis not present

## 2014-09-11 DIAGNOSIS — Z7982 Long term (current) use of aspirin: Secondary | ICD-10-CM | POA: Diagnosis not present

## 2014-09-11 DIAGNOSIS — Z791 Long term (current) use of non-steroidal anti-inflammatories (NSAID): Secondary | ICD-10-CM | POA: Diagnosis not present

## 2014-09-11 DIAGNOSIS — Z6822 Body mass index (BMI) 22.0-22.9, adult: Secondary | ICD-10-CM | POA: Diagnosis not present

## 2014-09-11 DIAGNOSIS — B029 Zoster without complications: Secondary | ICD-10-CM | POA: Diagnosis not present

## 2014-09-11 DIAGNOSIS — R32 Unspecified urinary incontinence: Secondary | ICD-10-CM | POA: Diagnosis not present

## 2014-09-11 DIAGNOSIS — G8929 Other chronic pain: Secondary | ICD-10-CM | POA: Diagnosis not present

## 2014-09-11 DIAGNOSIS — B0229 Other postherpetic nervous system involvement: Secondary | ICD-10-CM | POA: Diagnosis not present

## 2014-09-11 DIAGNOSIS — F329 Major depressive disorder, single episode, unspecified: Secondary | ICD-10-CM | POA: Diagnosis not present

## 2014-09-11 DIAGNOSIS — I10 Essential (primary) hypertension: Secondary | ICD-10-CM | POA: Diagnosis not present

## 2014-09-26 DIAGNOSIS — B0229 Other postherpetic nervous system involvement: Secondary | ICD-10-CM | POA: Diagnosis not present

## 2014-09-26 DIAGNOSIS — F329 Major depressive disorder, single episode, unspecified: Secondary | ICD-10-CM | POA: Diagnosis not present

## 2014-09-26 DIAGNOSIS — F419 Anxiety disorder, unspecified: Secondary | ICD-10-CM | POA: Diagnosis not present

## 2014-09-26 DIAGNOSIS — R5382 Chronic fatigue, unspecified: Secondary | ICD-10-CM | POA: Diagnosis not present

## 2014-09-26 DIAGNOSIS — R319 Hematuria, unspecified: Secondary | ICD-10-CM | POA: Diagnosis not present

## 2014-10-01 DIAGNOSIS — R5382 Chronic fatigue, unspecified: Secondary | ICD-10-CM | POA: Diagnosis not present

## 2014-10-01 DIAGNOSIS — E559 Vitamin D deficiency, unspecified: Secondary | ICD-10-CM | POA: Diagnosis not present

## 2014-10-09 DIAGNOSIS — N3001 Acute cystitis with hematuria: Secondary | ICD-10-CM | POA: Diagnosis not present

## 2014-10-09 DIAGNOSIS — R319 Hematuria, unspecified: Secondary | ICD-10-CM | POA: Diagnosis not present

## 2014-10-09 DIAGNOSIS — N390 Urinary tract infection, site not specified: Secondary | ICD-10-CM | POA: Diagnosis not present

## 2014-10-20 DIAGNOSIS — N390 Urinary tract infection, site not specified: Secondary | ICD-10-CM | POA: Diagnosis not present

## 2014-10-22 DIAGNOSIS — N390 Urinary tract infection, site not specified: Secondary | ICD-10-CM | POA: Diagnosis not present

## 2014-10-22 DIAGNOSIS — R319 Hematuria, unspecified: Secondary | ICD-10-CM | POA: Diagnosis not present

## 2014-11-08 ENCOUNTER — Encounter (HOSPITAL_COMMUNITY): Payer: Medicare PPO

## 2014-11-08 ENCOUNTER — Encounter (HOSPITAL_COMMUNITY): Admission: RE | Admit: 2014-11-08 | Payer: Medicare PPO | Source: Ambulatory Visit

## 2014-12-06 DIAGNOSIS — Z23 Encounter for immunization: Secondary | ICD-10-CM | POA: Diagnosis not present

## 2014-12-06 DIAGNOSIS — B0229 Other postherpetic nervous system involvement: Secondary | ICD-10-CM | POA: Diagnosis not present

## 2014-12-06 DIAGNOSIS — F418 Other specified anxiety disorders: Secondary | ICD-10-CM | POA: Diagnosis not present

## 2015-01-07 DIAGNOSIS — M810 Age-related osteoporosis without current pathological fracture: Secondary | ICD-10-CM | POA: Diagnosis not present

## 2015-01-07 DIAGNOSIS — M818 Other osteoporosis without current pathological fracture: Secondary | ICD-10-CM | POA: Diagnosis not present

## 2015-01-10 DIAGNOSIS — B0223 Postherpetic polyneuropathy: Secondary | ICD-10-CM | POA: Diagnosis not present

## 2015-01-10 DIAGNOSIS — I10 Essential (primary) hypertension: Secondary | ICD-10-CM | POA: Diagnosis not present

## 2015-01-10 DIAGNOSIS — F329 Major depressive disorder, single episode, unspecified: Secondary | ICD-10-CM | POA: Diagnosis not present

## 2015-02-12 DIAGNOSIS — L603 Nail dystrophy: Secondary | ICD-10-CM | POA: Diagnosis not present

## 2015-02-12 DIAGNOSIS — L600 Ingrowing nail: Secondary | ICD-10-CM | POA: Diagnosis not present

## 2015-02-12 DIAGNOSIS — M79675 Pain in left toe(s): Secondary | ICD-10-CM | POA: Diagnosis not present

## 2015-02-12 DIAGNOSIS — M79674 Pain in right toe(s): Secondary | ICD-10-CM | POA: Diagnosis not present

## 2015-03-15 DIAGNOSIS — J069 Acute upper respiratory infection, unspecified: Secondary | ICD-10-CM | POA: Diagnosis not present

## 2015-03-18 DIAGNOSIS — M5136 Other intervertebral disc degeneration, lumbar region: Secondary | ICD-10-CM | POA: Diagnosis not present

## 2015-03-18 DIAGNOSIS — M9903 Segmental and somatic dysfunction of lumbar region: Secondary | ICD-10-CM | POA: Diagnosis not present

## 2015-03-18 DIAGNOSIS — M791 Myalgia: Secondary | ICD-10-CM | POA: Diagnosis not present

## 2015-03-18 DIAGNOSIS — M9904 Segmental and somatic dysfunction of sacral region: Secondary | ICD-10-CM | POA: Diagnosis not present

## 2015-03-20 DIAGNOSIS — M545 Low back pain: Secondary | ICD-10-CM | POA: Diagnosis not present

## 2015-03-20 DIAGNOSIS — M9903 Segmental and somatic dysfunction of lumbar region: Secondary | ICD-10-CM | POA: Diagnosis not present

## 2015-03-20 DIAGNOSIS — M542 Cervicalgia: Secondary | ICD-10-CM | POA: Diagnosis not present

## 2015-03-20 DIAGNOSIS — M9902 Segmental and somatic dysfunction of thoracic region: Secondary | ICD-10-CM | POA: Diagnosis not present

## 2015-03-20 DIAGNOSIS — M9904 Segmental and somatic dysfunction of sacral region: Secondary | ICD-10-CM | POA: Diagnosis not present

## 2015-03-20 DIAGNOSIS — S335XXA Sprain of ligaments of lumbar spine, initial encounter: Secondary | ICD-10-CM | POA: Diagnosis not present

## 2015-03-22 DIAGNOSIS — M5136 Other intervertebral disc degeneration, lumbar region: Secondary | ICD-10-CM | POA: Diagnosis not present

## 2015-03-22 DIAGNOSIS — M9903 Segmental and somatic dysfunction of lumbar region: Secondary | ICD-10-CM | POA: Diagnosis not present

## 2015-03-22 DIAGNOSIS — M791 Myalgia: Secondary | ICD-10-CM | POA: Diagnosis not present

## 2015-03-22 DIAGNOSIS — M9904 Segmental and somatic dysfunction of sacral region: Secondary | ICD-10-CM | POA: Diagnosis not present

## 2015-03-27 DIAGNOSIS — M545 Low back pain: Secondary | ICD-10-CM | POA: Diagnosis not present

## 2015-03-27 DIAGNOSIS — M542 Cervicalgia: Secondary | ICD-10-CM | POA: Diagnosis not present

## 2015-03-27 DIAGNOSIS — S335XXA Sprain of ligaments of lumbar spine, initial encounter: Secondary | ICD-10-CM | POA: Diagnosis not present

## 2015-03-27 DIAGNOSIS — M9902 Segmental and somatic dysfunction of thoracic region: Secondary | ICD-10-CM | POA: Diagnosis not present

## 2015-03-27 DIAGNOSIS — M9904 Segmental and somatic dysfunction of sacral region: Secondary | ICD-10-CM | POA: Diagnosis not present

## 2015-03-27 DIAGNOSIS — M9903 Segmental and somatic dysfunction of lumbar region: Secondary | ICD-10-CM | POA: Diagnosis not present

## 2015-04-03 DIAGNOSIS — M542 Cervicalgia: Secondary | ICD-10-CM | POA: Diagnosis not present

## 2015-04-03 DIAGNOSIS — M9902 Segmental and somatic dysfunction of thoracic region: Secondary | ICD-10-CM | POA: Diagnosis not present

## 2015-04-03 DIAGNOSIS — S335XXA Sprain of ligaments of lumbar spine, initial encounter: Secondary | ICD-10-CM | POA: Diagnosis not present

## 2015-04-03 DIAGNOSIS — M9904 Segmental and somatic dysfunction of sacral region: Secondary | ICD-10-CM | POA: Diagnosis not present

## 2015-04-03 DIAGNOSIS — M9903 Segmental and somatic dysfunction of lumbar region: Secondary | ICD-10-CM | POA: Diagnosis not present

## 2015-04-03 DIAGNOSIS — M545 Low back pain: Secondary | ICD-10-CM | POA: Diagnosis not present

## 2015-04-05 DIAGNOSIS — M9904 Segmental and somatic dysfunction of sacral region: Secondary | ICD-10-CM | POA: Diagnosis not present

## 2015-04-05 DIAGNOSIS — M5136 Other intervertebral disc degeneration, lumbar region: Secondary | ICD-10-CM | POA: Diagnosis not present

## 2015-04-05 DIAGNOSIS — M9903 Segmental and somatic dysfunction of lumbar region: Secondary | ICD-10-CM | POA: Diagnosis not present

## 2015-04-05 DIAGNOSIS — M791 Myalgia: Secondary | ICD-10-CM | POA: Diagnosis not present

## 2015-04-18 DIAGNOSIS — M9902 Segmental and somatic dysfunction of thoracic region: Secondary | ICD-10-CM | POA: Diagnosis not present

## 2015-04-18 DIAGNOSIS — M9904 Segmental and somatic dysfunction of sacral region: Secondary | ICD-10-CM | POA: Diagnosis not present

## 2015-04-18 DIAGNOSIS — S335XXA Sprain of ligaments of lumbar spine, initial encounter: Secondary | ICD-10-CM | POA: Diagnosis not present

## 2015-04-18 DIAGNOSIS — M542 Cervicalgia: Secondary | ICD-10-CM | POA: Diagnosis not present

## 2015-04-18 DIAGNOSIS — M9903 Segmental and somatic dysfunction of lumbar region: Secondary | ICD-10-CM | POA: Diagnosis not present

## 2015-04-18 DIAGNOSIS — M545 Low back pain: Secondary | ICD-10-CM | POA: Diagnosis not present

## 2015-04-22 DIAGNOSIS — M9903 Segmental and somatic dysfunction of lumbar region: Secondary | ICD-10-CM | POA: Diagnosis not present

## 2015-04-22 DIAGNOSIS — M791 Myalgia: Secondary | ICD-10-CM | POA: Diagnosis not present

## 2015-04-22 DIAGNOSIS — M5136 Other intervertebral disc degeneration, lumbar region: Secondary | ICD-10-CM | POA: Diagnosis not present

## 2015-04-22 DIAGNOSIS — M9904 Segmental and somatic dysfunction of sacral region: Secondary | ICD-10-CM | POA: Diagnosis not present

## 2015-05-08 DIAGNOSIS — M9903 Segmental and somatic dysfunction of lumbar region: Secondary | ICD-10-CM | POA: Diagnosis not present

## 2015-05-08 DIAGNOSIS — M791 Myalgia: Secondary | ICD-10-CM | POA: Diagnosis not present

## 2015-05-08 DIAGNOSIS — M9904 Segmental and somatic dysfunction of sacral region: Secondary | ICD-10-CM | POA: Diagnosis not present

## 2015-05-08 DIAGNOSIS — M5136 Other intervertebral disc degeneration, lumbar region: Secondary | ICD-10-CM | POA: Diagnosis not present

## 2015-05-24 DIAGNOSIS — M9904 Segmental and somatic dysfunction of sacral region: Secondary | ICD-10-CM | POA: Diagnosis not present

## 2015-05-24 DIAGNOSIS — M9902 Segmental and somatic dysfunction of thoracic region: Secondary | ICD-10-CM | POA: Diagnosis not present

## 2015-05-24 DIAGNOSIS — M9903 Segmental and somatic dysfunction of lumbar region: Secondary | ICD-10-CM | POA: Diagnosis not present

## 2015-05-24 DIAGNOSIS — M542 Cervicalgia: Secondary | ICD-10-CM | POA: Diagnosis not present

## 2015-05-24 DIAGNOSIS — M545 Low back pain: Secondary | ICD-10-CM | POA: Diagnosis not present

## 2015-05-24 DIAGNOSIS — S335XXA Sprain of ligaments of lumbar spine, initial encounter: Secondary | ICD-10-CM | POA: Diagnosis not present

## 2015-05-29 DIAGNOSIS — Z79899 Other long term (current) drug therapy: Secondary | ICD-10-CM | POA: Diagnosis not present

## 2015-05-29 DIAGNOSIS — I10 Essential (primary) hypertension: Secondary | ICD-10-CM | POA: Diagnosis not present

## 2015-05-29 DIAGNOSIS — E785 Hyperlipidemia, unspecified: Secondary | ICD-10-CM | POA: Diagnosis not present

## 2015-05-29 DIAGNOSIS — R9431 Abnormal electrocardiogram [ECG] [EKG]: Secondary | ICD-10-CM | POA: Diagnosis not present

## 2015-05-29 DIAGNOSIS — J069 Acute upper respiratory infection, unspecified: Secondary | ICD-10-CM | POA: Diagnosis not present

## 2015-05-29 DIAGNOSIS — N390 Urinary tract infection, site not specified: Secondary | ICD-10-CM | POA: Diagnosis not present

## 2015-05-31 DIAGNOSIS — M5136 Other intervertebral disc degeneration, lumbar region: Secondary | ICD-10-CM | POA: Diagnosis not present

## 2015-05-31 DIAGNOSIS — M791 Myalgia: Secondary | ICD-10-CM | POA: Diagnosis not present

## 2015-05-31 DIAGNOSIS — M9904 Segmental and somatic dysfunction of sacral region: Secondary | ICD-10-CM | POA: Diagnosis not present

## 2015-05-31 DIAGNOSIS — M9903 Segmental and somatic dysfunction of lumbar region: Secondary | ICD-10-CM | POA: Diagnosis not present

## 2015-06-08 DIAGNOSIS — N390 Urinary tract infection, site not specified: Secondary | ICD-10-CM | POA: Diagnosis not present

## 2015-06-12 DIAGNOSIS — R319 Hematuria, unspecified: Secondary | ICD-10-CM | POA: Diagnosis not present

## 2015-06-18 DIAGNOSIS — R319 Hematuria, unspecified: Secondary | ICD-10-CM | POA: Diagnosis not present

## 2015-06-19 DIAGNOSIS — Z87442 Personal history of urinary calculi: Secondary | ICD-10-CM | POA: Diagnosis not present

## 2015-06-19 DIAGNOSIS — R197 Diarrhea, unspecified: Secondary | ICD-10-CM | POA: Diagnosis not present

## 2015-06-19 DIAGNOSIS — N8110 Cystocele, unspecified: Secondary | ICD-10-CM | POA: Diagnosis not present

## 2015-06-19 DIAGNOSIS — K639 Disease of intestine, unspecified: Secondary | ICD-10-CM | POA: Diagnosis not present

## 2015-06-19 DIAGNOSIS — N200 Calculus of kidney: Secondary | ICD-10-CM | POA: Diagnosis not present

## 2015-06-19 DIAGNOSIS — K7689 Other specified diseases of liver: Secondary | ICD-10-CM | POA: Diagnosis not present

## 2015-06-19 DIAGNOSIS — R319 Hematuria, unspecified: Secondary | ICD-10-CM | POA: Diagnosis not present

## 2015-06-21 DIAGNOSIS — M791 Myalgia: Secondary | ICD-10-CM | POA: Diagnosis not present

## 2015-06-21 DIAGNOSIS — M9903 Segmental and somatic dysfunction of lumbar region: Secondary | ICD-10-CM | POA: Diagnosis not present

## 2015-06-21 DIAGNOSIS — M9904 Segmental and somatic dysfunction of sacral region: Secondary | ICD-10-CM | POA: Diagnosis not present

## 2015-06-21 DIAGNOSIS — M5136 Other intervertebral disc degeneration, lumbar region: Secondary | ICD-10-CM | POA: Diagnosis not present

## 2015-06-24 DIAGNOSIS — R319 Hematuria, unspecified: Secondary | ICD-10-CM | POA: Diagnosis not present

## 2015-06-24 DIAGNOSIS — N8111 Cystocele, midline: Secondary | ICD-10-CM | POA: Diagnosis not present

## 2015-07-02 DIAGNOSIS — M9902 Segmental and somatic dysfunction of thoracic region: Secondary | ICD-10-CM | POA: Diagnosis not present

## 2015-07-02 DIAGNOSIS — M9903 Segmental and somatic dysfunction of lumbar region: Secondary | ICD-10-CM | POA: Diagnosis not present

## 2015-07-02 DIAGNOSIS — M545 Low back pain: Secondary | ICD-10-CM | POA: Diagnosis not present

## 2015-07-02 DIAGNOSIS — M9904 Segmental and somatic dysfunction of sacral region: Secondary | ICD-10-CM | POA: Diagnosis not present

## 2015-07-02 DIAGNOSIS — M542 Cervicalgia: Secondary | ICD-10-CM | POA: Diagnosis not present

## 2015-07-02 DIAGNOSIS — S335XXA Sprain of ligaments of lumbar spine, initial encounter: Secondary | ICD-10-CM | POA: Diagnosis not present

## 2015-07-04 DIAGNOSIS — Z1231 Encounter for screening mammogram for malignant neoplasm of breast: Secondary | ICD-10-CM | POA: Diagnosis not present

## 2015-07-04 DIAGNOSIS — F419 Anxiety disorder, unspecified: Secondary | ICD-10-CM | POA: Diagnosis not present

## 2015-07-04 DIAGNOSIS — B0229 Other postherpetic nervous system involvement: Secondary | ICD-10-CM | POA: Diagnosis not present

## 2015-07-04 DIAGNOSIS — E2839 Other primary ovarian failure: Secondary | ICD-10-CM | POA: Diagnosis not present

## 2015-07-15 DIAGNOSIS — M810 Age-related osteoporosis without current pathological fracture: Secondary | ICD-10-CM | POA: Diagnosis not present

## 2015-07-19 DIAGNOSIS — M5136 Other intervertebral disc degeneration, lumbar region: Secondary | ICD-10-CM | POA: Diagnosis not present

## 2015-07-19 DIAGNOSIS — M9903 Segmental and somatic dysfunction of lumbar region: Secondary | ICD-10-CM | POA: Diagnosis not present

## 2015-07-19 DIAGNOSIS — M791 Myalgia: Secondary | ICD-10-CM | POA: Diagnosis not present

## 2015-07-19 DIAGNOSIS — M9904 Segmental and somatic dysfunction of sacral region: Secondary | ICD-10-CM | POA: Diagnosis not present

## 2015-08-07 DIAGNOSIS — M5136 Other intervertebral disc degeneration, lumbar region: Secondary | ICD-10-CM | POA: Diagnosis not present

## 2015-08-07 DIAGNOSIS — M791 Myalgia: Secondary | ICD-10-CM | POA: Diagnosis not present

## 2015-08-07 DIAGNOSIS — M9904 Segmental and somatic dysfunction of sacral region: Secondary | ICD-10-CM | POA: Diagnosis not present

## 2015-08-07 DIAGNOSIS — M9903 Segmental and somatic dysfunction of lumbar region: Secondary | ICD-10-CM | POA: Diagnosis not present

## 2015-08-19 DIAGNOSIS — M810 Age-related osteoporosis without current pathological fracture: Secondary | ICD-10-CM | POA: Diagnosis not present

## 2015-08-23 DIAGNOSIS — M9902 Segmental and somatic dysfunction of thoracic region: Secondary | ICD-10-CM | POA: Diagnosis not present

## 2015-08-23 DIAGNOSIS — M545 Low back pain: Secondary | ICD-10-CM | POA: Diagnosis not present

## 2015-08-23 DIAGNOSIS — S335XXA Sprain of ligaments of lumbar spine, initial encounter: Secondary | ICD-10-CM | POA: Diagnosis not present

## 2015-08-23 DIAGNOSIS — M542 Cervicalgia: Secondary | ICD-10-CM | POA: Diagnosis not present

## 2015-08-23 DIAGNOSIS — M9903 Segmental and somatic dysfunction of lumbar region: Secondary | ICD-10-CM | POA: Diagnosis not present

## 2015-08-23 DIAGNOSIS — M9904 Segmental and somatic dysfunction of sacral region: Secondary | ICD-10-CM | POA: Diagnosis not present

## 2015-09-04 DIAGNOSIS — M9903 Segmental and somatic dysfunction of lumbar region: Secondary | ICD-10-CM | POA: Diagnosis not present

## 2015-09-04 DIAGNOSIS — M5136 Other intervertebral disc degeneration, lumbar region: Secondary | ICD-10-CM | POA: Diagnosis not present

## 2015-09-04 DIAGNOSIS — M9904 Segmental and somatic dysfunction of sacral region: Secondary | ICD-10-CM | POA: Diagnosis not present

## 2015-09-04 DIAGNOSIS — M791 Myalgia: Secondary | ICD-10-CM | POA: Diagnosis not present

## 2015-10-11 DIAGNOSIS — M9904 Segmental and somatic dysfunction of sacral region: Secondary | ICD-10-CM | POA: Diagnosis not present

## 2015-10-11 DIAGNOSIS — M791 Myalgia: Secondary | ICD-10-CM | POA: Diagnosis not present

## 2015-10-11 DIAGNOSIS — M9903 Segmental and somatic dysfunction of lumbar region: Secondary | ICD-10-CM | POA: Diagnosis not present

## 2015-10-11 DIAGNOSIS — M5136 Other intervertebral disc degeneration, lumbar region: Secondary | ICD-10-CM | POA: Diagnosis not present

## 2015-10-30 DIAGNOSIS — R350 Frequency of micturition: Secondary | ICD-10-CM | POA: Diagnosis not present

## 2015-11-06 DIAGNOSIS — M9903 Segmental and somatic dysfunction of lumbar region: Secondary | ICD-10-CM | POA: Diagnosis not present

## 2015-11-06 DIAGNOSIS — M5136 Other intervertebral disc degeneration, lumbar region: Secondary | ICD-10-CM | POA: Diagnosis not present

## 2015-11-06 DIAGNOSIS — M9904 Segmental and somatic dysfunction of sacral region: Secondary | ICD-10-CM | POA: Diagnosis not present

## 2015-11-06 DIAGNOSIS — M791 Myalgia: Secondary | ICD-10-CM | POA: Diagnosis not present

## 2015-11-12 DIAGNOSIS — Z1231 Encounter for screening mammogram for malignant neoplasm of breast: Secondary | ICD-10-CM | POA: Diagnosis not present

## 2015-11-27 DIAGNOSIS — F329 Major depressive disorder, single episode, unspecified: Secondary | ICD-10-CM | POA: Diagnosis not present

## 2015-11-27 DIAGNOSIS — E785 Hyperlipidemia, unspecified: Secondary | ICD-10-CM | POA: Diagnosis not present

## 2015-11-27 DIAGNOSIS — M6281 Muscle weakness (generalized): Secondary | ICD-10-CM | POA: Diagnosis not present

## 2015-11-27 DIAGNOSIS — I509 Heart failure, unspecified: Secondary | ICD-10-CM | POA: Diagnosis not present

## 2015-11-27 DIAGNOSIS — I10 Essential (primary) hypertension: Secondary | ICD-10-CM | POA: Diagnosis not present

## 2015-11-27 DIAGNOSIS — R531 Weakness: Secondary | ICD-10-CM | POA: Diagnosis not present

## 2015-11-27 DIAGNOSIS — F411 Generalized anxiety disorder: Secondary | ICD-10-CM | POA: Diagnosis not present

## 2015-11-27 DIAGNOSIS — G9341 Metabolic encephalopathy: Secondary | ICD-10-CM | POA: Diagnosis not present

## 2015-11-27 DIAGNOSIS — M6282 Rhabdomyolysis: Secondary | ICD-10-CM | POA: Diagnosis not present

## 2015-11-27 DIAGNOSIS — A419 Sepsis, unspecified organism: Secondary | ICD-10-CM | POA: Diagnosis not present

## 2015-11-27 DIAGNOSIS — K219 Gastro-esophageal reflux disease without esophagitis: Secondary | ICD-10-CM | POA: Diagnosis not present

## 2015-11-27 DIAGNOSIS — N3001 Acute cystitis with hematuria: Secondary | ICD-10-CM | POA: Diagnosis not present

## 2015-11-27 DIAGNOSIS — R269 Unspecified abnormalities of gait and mobility: Secondary | ICD-10-CM | POA: Diagnosis not present

## 2015-11-27 DIAGNOSIS — R9431 Abnormal electrocardiogram [ECG] [EKG]: Secondary | ICD-10-CM | POA: Diagnosis not present

## 2015-11-27 DIAGNOSIS — A09 Infectious gastroenteritis and colitis, unspecified: Secondary | ICD-10-CM | POA: Diagnosis not present

## 2015-11-27 DIAGNOSIS — G9340 Encephalopathy, unspecified: Secondary | ICD-10-CM | POA: Diagnosis not present

## 2015-11-27 DIAGNOSIS — N390 Urinary tract infection, site not specified: Secondary | ICD-10-CM | POA: Diagnosis not present

## 2015-11-27 DIAGNOSIS — J9811 Atelectasis: Secondary | ICD-10-CM | POA: Diagnosis not present

## 2015-12-04 DIAGNOSIS — N3001 Acute cystitis with hematuria: Secondary | ICD-10-CM | POA: Diagnosis not present

## 2015-12-04 DIAGNOSIS — G9340 Encephalopathy, unspecified: Secondary | ICD-10-CM | POA: Diagnosis not present

## 2015-12-04 DIAGNOSIS — D72829 Elevated white blood cell count, unspecified: Secondary | ICD-10-CM | POA: Diagnosis not present

## 2015-12-04 DIAGNOSIS — K5900 Constipation, unspecified: Secondary | ICD-10-CM | POA: Diagnosis not present

## 2015-12-04 DIAGNOSIS — I10 Essential (primary) hypertension: Secondary | ICD-10-CM | POA: Diagnosis not present

## 2015-12-04 DIAGNOSIS — M6281 Muscle weakness (generalized): Secondary | ICD-10-CM | POA: Diagnosis not present

## 2015-12-04 DIAGNOSIS — R269 Unspecified abnormalities of gait and mobility: Secondary | ICD-10-CM | POA: Diagnosis not present

## 2015-12-04 DIAGNOSIS — R300 Dysuria: Secondary | ICD-10-CM | POA: Diagnosis not present

## 2015-12-04 DIAGNOSIS — A419 Sepsis, unspecified organism: Secondary | ICD-10-CM | POA: Diagnosis not present

## 2015-12-04 DIAGNOSIS — F329 Major depressive disorder, single episode, unspecified: Secondary | ICD-10-CM | POA: Diagnosis not present

## 2015-12-05 DIAGNOSIS — N3001 Acute cystitis with hematuria: Secondary | ICD-10-CM | POA: Diagnosis not present

## 2015-12-05 DIAGNOSIS — A419 Sepsis, unspecified organism: Secondary | ICD-10-CM | POA: Diagnosis not present

## 2015-12-05 DIAGNOSIS — I10 Essential (primary) hypertension: Secondary | ICD-10-CM | POA: Diagnosis not present

## 2015-12-05 DIAGNOSIS — F329 Major depressive disorder, single episode, unspecified: Secondary | ICD-10-CM | POA: Diagnosis not present

## 2015-12-09 DIAGNOSIS — F329 Major depressive disorder, single episode, unspecified: Secondary | ICD-10-CM | POA: Diagnosis not present

## 2015-12-09 DIAGNOSIS — I10 Essential (primary) hypertension: Secondary | ICD-10-CM | POA: Diagnosis not present

## 2015-12-09 DIAGNOSIS — N3001 Acute cystitis with hematuria: Secondary | ICD-10-CM | POA: Diagnosis not present

## 2015-12-09 DIAGNOSIS — G9340 Encephalopathy, unspecified: Secondary | ICD-10-CM | POA: Diagnosis not present

## 2015-12-11 DIAGNOSIS — I10 Essential (primary) hypertension: Secondary | ICD-10-CM | POA: Diagnosis not present

## 2015-12-11 DIAGNOSIS — D72829 Elevated white blood cell count, unspecified: Secondary | ICD-10-CM | POA: Diagnosis not present

## 2015-12-11 DIAGNOSIS — K5900 Constipation, unspecified: Secondary | ICD-10-CM | POA: Diagnosis not present

## 2015-12-11 DIAGNOSIS — F329 Major depressive disorder, single episode, unspecified: Secondary | ICD-10-CM | POA: Diagnosis not present

## 2015-12-16 DIAGNOSIS — K5900 Constipation, unspecified: Secondary | ICD-10-CM | POA: Diagnosis not present

## 2015-12-16 DIAGNOSIS — D72829 Elevated white blood cell count, unspecified: Secondary | ICD-10-CM | POA: Diagnosis not present

## 2015-12-16 DIAGNOSIS — I10 Essential (primary) hypertension: Secondary | ICD-10-CM | POA: Diagnosis not present

## 2015-12-16 DIAGNOSIS — F329 Major depressive disorder, single episode, unspecified: Secondary | ICD-10-CM | POA: Diagnosis not present

## 2015-12-18 DIAGNOSIS — F329 Major depressive disorder, single episode, unspecified: Secondary | ICD-10-CM | POA: Diagnosis not present

## 2015-12-18 DIAGNOSIS — K5900 Constipation, unspecified: Secondary | ICD-10-CM | POA: Diagnosis not present

## 2015-12-18 DIAGNOSIS — R300 Dysuria: Secondary | ICD-10-CM | POA: Diagnosis not present

## 2015-12-18 DIAGNOSIS — I10 Essential (primary) hypertension: Secondary | ICD-10-CM | POA: Diagnosis not present

## 2015-12-20 DIAGNOSIS — M810 Age-related osteoporosis without current pathological fracture: Secondary | ICD-10-CM | POA: Diagnosis not present

## 2015-12-20 DIAGNOSIS — M6281 Muscle weakness (generalized): Secondary | ICD-10-CM | POA: Diagnosis not present

## 2015-12-20 DIAGNOSIS — K5780 Diverticulitis of intestine, part unspecified, with perforation and abscess without bleeding: Secondary | ICD-10-CM | POA: Diagnosis not present

## 2015-12-20 DIAGNOSIS — I10 Essential (primary) hypertension: Secondary | ICD-10-CM | POA: Diagnosis not present

## 2015-12-20 DIAGNOSIS — Z87440 Personal history of urinary (tract) infections: Secondary | ICD-10-CM | POA: Diagnosis not present

## 2015-12-20 DIAGNOSIS — R296 Repeated falls: Secondary | ICD-10-CM | POA: Diagnosis not present

## 2015-12-20 DIAGNOSIS — D649 Anemia, unspecified: Secondary | ICD-10-CM | POA: Diagnosis not present

## 2015-12-20 DIAGNOSIS — F418 Other specified anxiety disorders: Secondary | ICD-10-CM | POA: Diagnosis not present

## 2015-12-24 DIAGNOSIS — K5780 Diverticulitis of intestine, part unspecified, with perforation and abscess without bleeding: Secondary | ICD-10-CM | POA: Diagnosis not present

## 2015-12-24 DIAGNOSIS — R296 Repeated falls: Secondary | ICD-10-CM | POA: Diagnosis not present

## 2015-12-24 DIAGNOSIS — M810 Age-related osteoporosis without current pathological fracture: Secondary | ICD-10-CM | POA: Diagnosis not present

## 2015-12-24 DIAGNOSIS — Z87440 Personal history of urinary (tract) infections: Secondary | ICD-10-CM | POA: Diagnosis not present

## 2015-12-24 DIAGNOSIS — M6281 Muscle weakness (generalized): Secondary | ICD-10-CM | POA: Diagnosis not present

## 2015-12-24 DIAGNOSIS — F418 Other specified anxiety disorders: Secondary | ICD-10-CM | POA: Diagnosis not present

## 2015-12-24 DIAGNOSIS — I10 Essential (primary) hypertension: Secondary | ICD-10-CM | POA: Diagnosis not present

## 2015-12-24 DIAGNOSIS — D649 Anemia, unspecified: Secondary | ICD-10-CM | POA: Diagnosis not present

## 2015-12-25 DIAGNOSIS — M6281 Muscle weakness (generalized): Secondary | ICD-10-CM | POA: Diagnosis not present

## 2015-12-25 DIAGNOSIS — K5780 Diverticulitis of intestine, part unspecified, with perforation and abscess without bleeding: Secondary | ICD-10-CM | POA: Diagnosis not present

## 2015-12-25 DIAGNOSIS — I10 Essential (primary) hypertension: Secondary | ICD-10-CM | POA: Diagnosis not present

## 2015-12-25 DIAGNOSIS — D649 Anemia, unspecified: Secondary | ICD-10-CM | POA: Diagnosis not present

## 2015-12-25 DIAGNOSIS — R296 Repeated falls: Secondary | ICD-10-CM | POA: Diagnosis not present

## 2015-12-25 DIAGNOSIS — M810 Age-related osteoporosis without current pathological fracture: Secondary | ICD-10-CM | POA: Diagnosis not present

## 2015-12-25 DIAGNOSIS — F418 Other specified anxiety disorders: Secondary | ICD-10-CM | POA: Diagnosis not present

## 2015-12-25 DIAGNOSIS — Z87440 Personal history of urinary (tract) infections: Secondary | ICD-10-CM | POA: Diagnosis not present

## 2015-12-27 DIAGNOSIS — M810 Age-related osteoporosis without current pathological fracture: Secondary | ICD-10-CM | POA: Diagnosis not present

## 2015-12-27 DIAGNOSIS — K5780 Diverticulitis of intestine, part unspecified, with perforation and abscess without bleeding: Secondary | ICD-10-CM | POA: Diagnosis not present

## 2015-12-27 DIAGNOSIS — I10 Essential (primary) hypertension: Secondary | ICD-10-CM | POA: Diagnosis not present

## 2015-12-27 DIAGNOSIS — R296 Repeated falls: Secondary | ICD-10-CM | POA: Diagnosis not present

## 2015-12-27 DIAGNOSIS — F418 Other specified anxiety disorders: Secondary | ICD-10-CM | POA: Diagnosis not present

## 2015-12-27 DIAGNOSIS — N390 Urinary tract infection, site not specified: Secondary | ICD-10-CM | POA: Diagnosis not present

## 2015-12-27 DIAGNOSIS — M6281 Muscle weakness (generalized): Secondary | ICD-10-CM | POA: Diagnosis not present

## 2015-12-27 DIAGNOSIS — Z87440 Personal history of urinary (tract) infections: Secondary | ICD-10-CM | POA: Diagnosis not present

## 2015-12-27 DIAGNOSIS — R319 Hematuria, unspecified: Secondary | ICD-10-CM | POA: Diagnosis not present

## 2015-12-27 DIAGNOSIS — D649 Anemia, unspecified: Secondary | ICD-10-CM | POA: Diagnosis not present

## 2015-12-30 DIAGNOSIS — K5780 Diverticulitis of intestine, part unspecified, with perforation and abscess without bleeding: Secondary | ICD-10-CM | POA: Diagnosis not present

## 2015-12-30 DIAGNOSIS — D649 Anemia, unspecified: Secondary | ICD-10-CM | POA: Diagnosis not present

## 2015-12-30 DIAGNOSIS — R296 Repeated falls: Secondary | ICD-10-CM | POA: Diagnosis not present

## 2015-12-30 DIAGNOSIS — I10 Essential (primary) hypertension: Secondary | ICD-10-CM | POA: Diagnosis not present

## 2015-12-30 DIAGNOSIS — Z87440 Personal history of urinary (tract) infections: Secondary | ICD-10-CM | POA: Diagnosis not present

## 2015-12-30 DIAGNOSIS — F418 Other specified anxiety disorders: Secondary | ICD-10-CM | POA: Diagnosis not present

## 2015-12-30 DIAGNOSIS — M6281 Muscle weakness (generalized): Secondary | ICD-10-CM | POA: Diagnosis not present

## 2015-12-30 DIAGNOSIS — M810 Age-related osteoporosis without current pathological fracture: Secondary | ICD-10-CM | POA: Diagnosis not present

## 2016-01-01 DIAGNOSIS — Z87440 Personal history of urinary (tract) infections: Secondary | ICD-10-CM | POA: Diagnosis not present

## 2016-01-01 DIAGNOSIS — R296 Repeated falls: Secondary | ICD-10-CM | POA: Diagnosis not present

## 2016-01-01 DIAGNOSIS — M810 Age-related osteoporosis without current pathological fracture: Secondary | ICD-10-CM | POA: Diagnosis not present

## 2016-01-01 DIAGNOSIS — I10 Essential (primary) hypertension: Secondary | ICD-10-CM | POA: Diagnosis not present

## 2016-01-01 DIAGNOSIS — M6281 Muscle weakness (generalized): Secondary | ICD-10-CM | POA: Diagnosis not present

## 2016-01-01 DIAGNOSIS — F418 Other specified anxiety disorders: Secondary | ICD-10-CM | POA: Diagnosis not present

## 2016-01-01 DIAGNOSIS — K5780 Diverticulitis of intestine, part unspecified, with perforation and abscess without bleeding: Secondary | ICD-10-CM | POA: Diagnosis not present

## 2016-01-01 DIAGNOSIS — D649 Anemia, unspecified: Secondary | ICD-10-CM | POA: Diagnosis not present

## 2016-01-03 DIAGNOSIS — K5780 Diverticulitis of intestine, part unspecified, with perforation and abscess without bleeding: Secondary | ICD-10-CM | POA: Diagnosis not present

## 2016-01-03 DIAGNOSIS — M810 Age-related osteoporosis without current pathological fracture: Secondary | ICD-10-CM | POA: Diagnosis not present

## 2016-01-03 DIAGNOSIS — F418 Other specified anxiety disorders: Secondary | ICD-10-CM | POA: Diagnosis not present

## 2016-01-03 DIAGNOSIS — I10 Essential (primary) hypertension: Secondary | ICD-10-CM | POA: Diagnosis not present

## 2016-01-03 DIAGNOSIS — R296 Repeated falls: Secondary | ICD-10-CM | POA: Diagnosis not present

## 2016-01-03 DIAGNOSIS — D649 Anemia, unspecified: Secondary | ICD-10-CM | POA: Diagnosis not present

## 2016-01-03 DIAGNOSIS — M6281 Muscle weakness (generalized): Secondary | ICD-10-CM | POA: Diagnosis not present

## 2016-01-03 DIAGNOSIS — Z87440 Personal history of urinary (tract) infections: Secondary | ICD-10-CM | POA: Diagnosis not present

## 2016-01-06 DIAGNOSIS — R296 Repeated falls: Secondary | ICD-10-CM | POA: Diagnosis not present

## 2016-01-06 DIAGNOSIS — F418 Other specified anxiety disorders: Secondary | ICD-10-CM | POA: Diagnosis not present

## 2016-01-06 DIAGNOSIS — D649 Anemia, unspecified: Secondary | ICD-10-CM | POA: Diagnosis not present

## 2016-01-06 DIAGNOSIS — Z87440 Personal history of urinary (tract) infections: Secondary | ICD-10-CM | POA: Diagnosis not present

## 2016-01-06 DIAGNOSIS — K5780 Diverticulitis of intestine, part unspecified, with perforation and abscess without bleeding: Secondary | ICD-10-CM | POA: Diagnosis not present

## 2016-01-06 DIAGNOSIS — M810 Age-related osteoporosis without current pathological fracture: Secondary | ICD-10-CM | POA: Diagnosis not present

## 2016-01-06 DIAGNOSIS — M6281 Muscle weakness (generalized): Secondary | ICD-10-CM | POA: Diagnosis not present

## 2016-01-06 DIAGNOSIS — I10 Essential (primary) hypertension: Secondary | ICD-10-CM | POA: Diagnosis not present

## 2016-01-08 DIAGNOSIS — D649 Anemia, unspecified: Secondary | ICD-10-CM | POA: Diagnosis not present

## 2016-01-08 DIAGNOSIS — Z87440 Personal history of urinary (tract) infections: Secondary | ICD-10-CM | POA: Diagnosis not present

## 2016-01-08 DIAGNOSIS — N390 Urinary tract infection, site not specified: Secondary | ICD-10-CM | POA: Diagnosis not present

## 2016-01-08 DIAGNOSIS — M810 Age-related osteoporosis without current pathological fracture: Secondary | ICD-10-CM | POA: Diagnosis not present

## 2016-01-08 DIAGNOSIS — F3340 Major depressive disorder, recurrent, in remission, unspecified: Secondary | ICD-10-CM | POA: Diagnosis not present

## 2016-01-08 DIAGNOSIS — I10 Essential (primary) hypertension: Secondary | ICD-10-CM | POA: Diagnosis not present

## 2016-01-08 DIAGNOSIS — F418 Other specified anxiety disorders: Secondary | ICD-10-CM | POA: Diagnosis not present

## 2016-01-08 DIAGNOSIS — A419 Sepsis, unspecified organism: Secondary | ICD-10-CM | POA: Diagnosis not present

## 2016-01-08 DIAGNOSIS — R296 Repeated falls: Secondary | ICD-10-CM | POA: Diagnosis not present

## 2016-01-08 DIAGNOSIS — K5780 Diverticulitis of intestine, part unspecified, with perforation and abscess without bleeding: Secondary | ICD-10-CM | POA: Diagnosis not present

## 2016-01-08 DIAGNOSIS — M6281 Muscle weakness (generalized): Secondary | ICD-10-CM | POA: Diagnosis not present

## 2016-01-10 DIAGNOSIS — I10 Essential (primary) hypertension: Secondary | ICD-10-CM | POA: Diagnosis not present

## 2016-01-10 DIAGNOSIS — M6281 Muscle weakness (generalized): Secondary | ICD-10-CM | POA: Diagnosis not present

## 2016-01-10 DIAGNOSIS — M810 Age-related osteoporosis without current pathological fracture: Secondary | ICD-10-CM | POA: Diagnosis not present

## 2016-01-10 DIAGNOSIS — R296 Repeated falls: Secondary | ICD-10-CM | POA: Diagnosis not present

## 2016-01-10 DIAGNOSIS — D649 Anemia, unspecified: Secondary | ICD-10-CM | POA: Diagnosis not present

## 2016-01-10 DIAGNOSIS — F418 Other specified anxiety disorders: Secondary | ICD-10-CM | POA: Diagnosis not present

## 2016-01-10 DIAGNOSIS — Z87440 Personal history of urinary (tract) infections: Secondary | ICD-10-CM | POA: Diagnosis not present

## 2016-01-10 DIAGNOSIS — K5780 Diverticulitis of intestine, part unspecified, with perforation and abscess without bleeding: Secondary | ICD-10-CM | POA: Diagnosis not present

## 2016-01-13 DIAGNOSIS — F418 Other specified anxiety disorders: Secondary | ICD-10-CM | POA: Diagnosis not present

## 2016-01-13 DIAGNOSIS — I10 Essential (primary) hypertension: Secondary | ICD-10-CM | POA: Diagnosis not present

## 2016-01-13 DIAGNOSIS — M810 Age-related osteoporosis without current pathological fracture: Secondary | ICD-10-CM | POA: Diagnosis not present

## 2016-01-13 DIAGNOSIS — R296 Repeated falls: Secondary | ICD-10-CM | POA: Diagnosis not present

## 2016-01-13 DIAGNOSIS — M6281 Muscle weakness (generalized): Secondary | ICD-10-CM | POA: Diagnosis not present

## 2016-01-13 DIAGNOSIS — D649 Anemia, unspecified: Secondary | ICD-10-CM | POA: Diagnosis not present

## 2016-01-13 DIAGNOSIS — Z87440 Personal history of urinary (tract) infections: Secondary | ICD-10-CM | POA: Diagnosis not present

## 2016-01-13 DIAGNOSIS — K5780 Diverticulitis of intestine, part unspecified, with perforation and abscess without bleeding: Secondary | ICD-10-CM | POA: Diagnosis not present

## 2016-01-16 DIAGNOSIS — D649 Anemia, unspecified: Secondary | ICD-10-CM | POA: Diagnosis not present

## 2016-01-16 DIAGNOSIS — Z87440 Personal history of urinary (tract) infections: Secondary | ICD-10-CM | POA: Diagnosis not present

## 2016-01-16 DIAGNOSIS — F418 Other specified anxiety disorders: Secondary | ICD-10-CM | POA: Diagnosis not present

## 2016-01-16 DIAGNOSIS — M6281 Muscle weakness (generalized): Secondary | ICD-10-CM | POA: Diagnosis not present

## 2016-01-16 DIAGNOSIS — M810 Age-related osteoporosis without current pathological fracture: Secondary | ICD-10-CM | POA: Diagnosis not present

## 2016-01-16 DIAGNOSIS — I10 Essential (primary) hypertension: Secondary | ICD-10-CM | POA: Diagnosis not present

## 2016-01-16 DIAGNOSIS — R269 Unspecified abnormalities of gait and mobility: Secondary | ICD-10-CM | POA: Diagnosis not present

## 2016-01-16 DIAGNOSIS — R296 Repeated falls: Secondary | ICD-10-CM | POA: Diagnosis not present

## 2016-01-16 DIAGNOSIS — K5780 Diverticulitis of intestine, part unspecified, with perforation and abscess without bleeding: Secondary | ICD-10-CM | POA: Diagnosis not present

## 2016-01-27 DIAGNOSIS — N390 Urinary tract infection, site not specified: Secondary | ICD-10-CM | POA: Diagnosis not present

## 2016-01-27 DIAGNOSIS — R319 Hematuria, unspecified: Secondary | ICD-10-CM | POA: Diagnosis not present

## 2016-01-28 DIAGNOSIS — M810 Age-related osteoporosis without current pathological fracture: Secondary | ICD-10-CM | POA: Diagnosis not present

## 2016-02-26 DIAGNOSIS — M810 Age-related osteoporosis without current pathological fracture: Secondary | ICD-10-CM | POA: Diagnosis not present

## 2016-03-24 DIAGNOSIS — S0081XA Abrasion of other part of head, initial encounter: Secondary | ICD-10-CM | POA: Diagnosis not present

## 2016-03-24 DIAGNOSIS — M533 Sacrococcygeal disorders, not elsewhere classified: Secondary | ICD-10-CM | POA: Diagnosis not present

## 2016-03-24 DIAGNOSIS — S0993XA Unspecified injury of face, initial encounter: Secondary | ICD-10-CM | POA: Diagnosis not present

## 2016-03-24 DIAGNOSIS — Z9181 History of falling: Secondary | ICD-10-CM | POA: Diagnosis not present

## 2016-03-24 DIAGNOSIS — R600 Localized edema: Secondary | ICD-10-CM | POA: Diagnosis not present

## 2016-03-24 DIAGNOSIS — S0003XA Contusion of scalp, initial encounter: Secondary | ICD-10-CM | POA: Diagnosis not present

## 2016-03-24 DIAGNOSIS — S332XXA Dislocation of sacroiliac and sacrococcygeal joint, initial encounter: Secondary | ICD-10-CM | POA: Diagnosis not present

## 2016-03-24 DIAGNOSIS — Z043 Encounter for examination and observation following other accident: Secondary | ICD-10-CM | POA: Diagnosis not present

## 2016-03-26 DIAGNOSIS — S300XXD Contusion of lower back and pelvis, subsequent encounter: Secondary | ICD-10-CM | POA: Diagnosis not present

## 2016-03-26 DIAGNOSIS — I10 Essential (primary) hypertension: Secondary | ICD-10-CM | POA: Diagnosis not present

## 2016-03-26 DIAGNOSIS — D649 Anemia, unspecified: Secondary | ICD-10-CM | POA: Diagnosis not present

## 2016-03-26 DIAGNOSIS — S0093XD Contusion of unspecified part of head, subsequent encounter: Secondary | ICD-10-CM | POA: Diagnosis not present

## 2016-03-26 DIAGNOSIS — F3340 Major depressive disorder, recurrent, in remission, unspecified: Secondary | ICD-10-CM | POA: Diagnosis not present

## 2016-03-26 DIAGNOSIS — Z87440 Personal history of urinary (tract) infections: Secondary | ICD-10-CM | POA: Diagnosis not present

## 2016-03-26 DIAGNOSIS — M810 Age-related osteoporosis without current pathological fracture: Secondary | ICD-10-CM | POA: Diagnosis not present

## 2016-03-26 DIAGNOSIS — R296 Repeated falls: Secondary | ICD-10-CM | POA: Diagnosis not present

## 2016-03-27 DIAGNOSIS — F3340 Major depressive disorder, recurrent, in remission, unspecified: Secondary | ICD-10-CM | POA: Diagnosis not present

## 2016-03-27 DIAGNOSIS — I10 Essential (primary) hypertension: Secondary | ICD-10-CM | POA: Diagnosis not present

## 2016-03-27 DIAGNOSIS — D649 Anemia, unspecified: Secondary | ICD-10-CM | POA: Diagnosis not present

## 2016-03-27 DIAGNOSIS — R296 Repeated falls: Secondary | ICD-10-CM | POA: Diagnosis not present

## 2016-03-27 DIAGNOSIS — M810 Age-related osteoporosis without current pathological fracture: Secondary | ICD-10-CM | POA: Diagnosis not present

## 2016-03-27 DIAGNOSIS — S0093XD Contusion of unspecified part of head, subsequent encounter: Secondary | ICD-10-CM | POA: Diagnosis not present

## 2016-03-27 DIAGNOSIS — Z87440 Personal history of urinary (tract) infections: Secondary | ICD-10-CM | POA: Diagnosis not present

## 2016-03-27 DIAGNOSIS — S300XXD Contusion of lower back and pelvis, subsequent encounter: Secondary | ICD-10-CM | POA: Diagnosis not present

## 2016-04-01 DIAGNOSIS — M810 Age-related osteoporosis without current pathological fracture: Secondary | ICD-10-CM | POA: Diagnosis not present

## 2016-04-01 DIAGNOSIS — F3340 Major depressive disorder, recurrent, in remission, unspecified: Secondary | ICD-10-CM | POA: Diagnosis not present

## 2016-04-01 DIAGNOSIS — S0093XD Contusion of unspecified part of head, subsequent encounter: Secondary | ICD-10-CM | POA: Diagnosis not present

## 2016-04-01 DIAGNOSIS — D649 Anemia, unspecified: Secondary | ICD-10-CM | POA: Diagnosis not present

## 2016-04-01 DIAGNOSIS — Z87440 Personal history of urinary (tract) infections: Secondary | ICD-10-CM | POA: Diagnosis not present

## 2016-04-01 DIAGNOSIS — I10 Essential (primary) hypertension: Secondary | ICD-10-CM | POA: Diagnosis not present

## 2016-04-01 DIAGNOSIS — S300XXD Contusion of lower back and pelvis, subsequent encounter: Secondary | ICD-10-CM | POA: Diagnosis not present

## 2016-04-01 DIAGNOSIS — R296 Repeated falls: Secondary | ICD-10-CM | POA: Diagnosis not present

## 2016-04-03 DIAGNOSIS — I10 Essential (primary) hypertension: Secondary | ICD-10-CM | POA: Diagnosis not present

## 2016-04-03 DIAGNOSIS — M810 Age-related osteoporosis without current pathological fracture: Secondary | ICD-10-CM | POA: Diagnosis not present

## 2016-04-03 DIAGNOSIS — R296 Repeated falls: Secondary | ICD-10-CM | POA: Diagnosis not present

## 2016-04-03 DIAGNOSIS — F3340 Major depressive disorder, recurrent, in remission, unspecified: Secondary | ICD-10-CM | POA: Diagnosis not present

## 2016-04-03 DIAGNOSIS — D649 Anemia, unspecified: Secondary | ICD-10-CM | POA: Diagnosis not present

## 2016-04-03 DIAGNOSIS — Z87440 Personal history of urinary (tract) infections: Secondary | ICD-10-CM | POA: Diagnosis not present

## 2016-04-03 DIAGNOSIS — S0093XD Contusion of unspecified part of head, subsequent encounter: Secondary | ICD-10-CM | POA: Diagnosis not present

## 2016-04-03 DIAGNOSIS — S300XXD Contusion of lower back and pelvis, subsequent encounter: Secondary | ICD-10-CM | POA: Diagnosis not present

## 2016-04-06 DIAGNOSIS — Z87440 Personal history of urinary (tract) infections: Secondary | ICD-10-CM | POA: Diagnosis not present

## 2016-04-06 DIAGNOSIS — S300XXD Contusion of lower back and pelvis, subsequent encounter: Secondary | ICD-10-CM | POA: Diagnosis not present

## 2016-04-06 DIAGNOSIS — F3340 Major depressive disorder, recurrent, in remission, unspecified: Secondary | ICD-10-CM | POA: Diagnosis not present

## 2016-04-06 DIAGNOSIS — M810 Age-related osteoporosis without current pathological fracture: Secondary | ICD-10-CM | POA: Diagnosis not present

## 2016-04-06 DIAGNOSIS — D649 Anemia, unspecified: Secondary | ICD-10-CM | POA: Diagnosis not present

## 2016-04-06 DIAGNOSIS — R296 Repeated falls: Secondary | ICD-10-CM | POA: Diagnosis not present

## 2016-04-06 DIAGNOSIS — S0093XD Contusion of unspecified part of head, subsequent encounter: Secondary | ICD-10-CM | POA: Diagnosis not present

## 2016-04-06 DIAGNOSIS — I10 Essential (primary) hypertension: Secondary | ICD-10-CM | POA: Diagnosis not present

## 2016-04-08 DIAGNOSIS — M9904 Segmental and somatic dysfunction of sacral region: Secondary | ICD-10-CM | POA: Diagnosis not present

## 2016-04-08 DIAGNOSIS — M9903 Segmental and somatic dysfunction of lumbar region: Secondary | ICD-10-CM | POA: Diagnosis not present

## 2016-04-08 DIAGNOSIS — M791 Myalgia: Secondary | ICD-10-CM | POA: Diagnosis not present

## 2016-04-08 DIAGNOSIS — M5136 Other intervertebral disc degeneration, lumbar region: Secondary | ICD-10-CM | POA: Diagnosis not present

## 2016-04-09 DIAGNOSIS — F3340 Major depressive disorder, recurrent, in remission, unspecified: Secondary | ICD-10-CM | POA: Diagnosis not present

## 2016-04-09 DIAGNOSIS — R296 Repeated falls: Secondary | ICD-10-CM | POA: Diagnosis not present

## 2016-04-09 DIAGNOSIS — S300XXD Contusion of lower back and pelvis, subsequent encounter: Secondary | ICD-10-CM | POA: Diagnosis not present

## 2016-04-09 DIAGNOSIS — Z87440 Personal history of urinary (tract) infections: Secondary | ICD-10-CM | POA: Diagnosis not present

## 2016-04-09 DIAGNOSIS — D649 Anemia, unspecified: Secondary | ICD-10-CM | POA: Diagnosis not present

## 2016-04-09 DIAGNOSIS — M810 Age-related osteoporosis without current pathological fracture: Secondary | ICD-10-CM | POA: Diagnosis not present

## 2016-04-09 DIAGNOSIS — I10 Essential (primary) hypertension: Secondary | ICD-10-CM | POA: Diagnosis not present

## 2016-04-09 DIAGNOSIS — S0093XD Contusion of unspecified part of head, subsequent encounter: Secondary | ICD-10-CM | POA: Diagnosis not present

## 2016-04-10 DIAGNOSIS — I10 Essential (primary) hypertension: Secondary | ICD-10-CM | POA: Diagnosis not present

## 2016-04-10 DIAGNOSIS — S300XXD Contusion of lower back and pelvis, subsequent encounter: Secondary | ICD-10-CM | POA: Diagnosis not present

## 2016-04-10 DIAGNOSIS — F3340 Major depressive disorder, recurrent, in remission, unspecified: Secondary | ICD-10-CM | POA: Diagnosis not present

## 2016-04-10 DIAGNOSIS — R296 Repeated falls: Secondary | ICD-10-CM | POA: Diagnosis not present

## 2016-04-10 DIAGNOSIS — D649 Anemia, unspecified: Secondary | ICD-10-CM | POA: Diagnosis not present

## 2016-04-10 DIAGNOSIS — M810 Age-related osteoporosis without current pathological fracture: Secondary | ICD-10-CM | POA: Diagnosis not present

## 2016-04-10 DIAGNOSIS — Z87440 Personal history of urinary (tract) infections: Secondary | ICD-10-CM | POA: Diagnosis not present

## 2016-04-10 DIAGNOSIS — S0093XD Contusion of unspecified part of head, subsequent encounter: Secondary | ICD-10-CM | POA: Diagnosis not present

## 2016-04-14 DIAGNOSIS — M810 Age-related osteoporosis without current pathological fracture: Secondary | ICD-10-CM | POA: Diagnosis not present

## 2016-04-14 DIAGNOSIS — I10 Essential (primary) hypertension: Secondary | ICD-10-CM | POA: Diagnosis not present

## 2016-04-14 DIAGNOSIS — D649 Anemia, unspecified: Secondary | ICD-10-CM | POA: Diagnosis not present

## 2016-04-14 DIAGNOSIS — R296 Repeated falls: Secondary | ICD-10-CM | POA: Diagnosis not present

## 2016-04-14 DIAGNOSIS — S300XXD Contusion of lower back and pelvis, subsequent encounter: Secondary | ICD-10-CM | POA: Diagnosis not present

## 2016-04-14 DIAGNOSIS — F3340 Major depressive disorder, recurrent, in remission, unspecified: Secondary | ICD-10-CM | POA: Diagnosis not present

## 2016-04-14 DIAGNOSIS — S0093XD Contusion of unspecified part of head, subsequent encounter: Secondary | ICD-10-CM | POA: Diagnosis not present

## 2016-04-14 DIAGNOSIS — Z87440 Personal history of urinary (tract) infections: Secondary | ICD-10-CM | POA: Diagnosis not present

## 2016-04-15 DIAGNOSIS — M9903 Segmental and somatic dysfunction of lumbar region: Secondary | ICD-10-CM | POA: Diagnosis not present

## 2016-04-15 DIAGNOSIS — M9904 Segmental and somatic dysfunction of sacral region: Secondary | ICD-10-CM | POA: Diagnosis not present

## 2016-04-15 DIAGNOSIS — M5136 Other intervertebral disc degeneration, lumbar region: Secondary | ICD-10-CM | POA: Diagnosis not present

## 2016-04-15 DIAGNOSIS — M791 Myalgia: Secondary | ICD-10-CM | POA: Diagnosis not present

## 2016-04-16 DIAGNOSIS — I10 Essential (primary) hypertension: Secondary | ICD-10-CM | POA: Diagnosis not present

## 2016-04-16 DIAGNOSIS — S300XXD Contusion of lower back and pelvis, subsequent encounter: Secondary | ICD-10-CM | POA: Diagnosis not present

## 2016-04-16 DIAGNOSIS — Z87440 Personal history of urinary (tract) infections: Secondary | ICD-10-CM | POA: Diagnosis not present

## 2016-04-16 DIAGNOSIS — S0093XD Contusion of unspecified part of head, subsequent encounter: Secondary | ICD-10-CM | POA: Diagnosis not present

## 2016-04-16 DIAGNOSIS — D649 Anemia, unspecified: Secondary | ICD-10-CM | POA: Diagnosis not present

## 2016-04-16 DIAGNOSIS — M810 Age-related osteoporosis without current pathological fracture: Secondary | ICD-10-CM | POA: Diagnosis not present

## 2016-04-16 DIAGNOSIS — R296 Repeated falls: Secondary | ICD-10-CM | POA: Diagnosis not present

## 2016-04-16 DIAGNOSIS — F3340 Major depressive disorder, recurrent, in remission, unspecified: Secondary | ICD-10-CM | POA: Diagnosis not present

## 2016-04-17 DIAGNOSIS — D649 Anemia, unspecified: Secondary | ICD-10-CM | POA: Diagnosis not present

## 2016-04-17 DIAGNOSIS — S300XXD Contusion of lower back and pelvis, subsequent encounter: Secondary | ICD-10-CM | POA: Diagnosis not present

## 2016-04-17 DIAGNOSIS — M810 Age-related osteoporosis without current pathological fracture: Secondary | ICD-10-CM | POA: Diagnosis not present

## 2016-04-17 DIAGNOSIS — Z87440 Personal history of urinary (tract) infections: Secondary | ICD-10-CM | POA: Diagnosis not present

## 2016-04-17 DIAGNOSIS — I10 Essential (primary) hypertension: Secondary | ICD-10-CM | POA: Diagnosis not present

## 2016-04-17 DIAGNOSIS — N390 Urinary tract infection, site not specified: Secondary | ICD-10-CM | POA: Diagnosis not present

## 2016-04-17 DIAGNOSIS — F3340 Major depressive disorder, recurrent, in remission, unspecified: Secondary | ICD-10-CM | POA: Diagnosis not present

## 2016-04-17 DIAGNOSIS — S0093XD Contusion of unspecified part of head, subsequent encounter: Secondary | ICD-10-CM | POA: Diagnosis not present

## 2016-04-17 DIAGNOSIS — E7800 Pure hypercholesterolemia, unspecified: Secondary | ICD-10-CM | POA: Diagnosis not present

## 2016-04-17 DIAGNOSIS — R296 Repeated falls: Secondary | ICD-10-CM | POA: Diagnosis not present

## 2016-04-20 DIAGNOSIS — D649 Anemia, unspecified: Secondary | ICD-10-CM | POA: Diagnosis not present

## 2016-04-20 DIAGNOSIS — S300XXD Contusion of lower back and pelvis, subsequent encounter: Secondary | ICD-10-CM | POA: Diagnosis not present

## 2016-04-20 DIAGNOSIS — I10 Essential (primary) hypertension: Secondary | ICD-10-CM | POA: Diagnosis not present

## 2016-04-20 DIAGNOSIS — R296 Repeated falls: Secondary | ICD-10-CM | POA: Diagnosis not present

## 2016-04-20 DIAGNOSIS — Z87440 Personal history of urinary (tract) infections: Secondary | ICD-10-CM | POA: Diagnosis not present

## 2016-04-20 DIAGNOSIS — M810 Age-related osteoporosis without current pathological fracture: Secondary | ICD-10-CM | POA: Diagnosis not present

## 2016-04-20 DIAGNOSIS — F3340 Major depressive disorder, recurrent, in remission, unspecified: Secondary | ICD-10-CM | POA: Diagnosis not present

## 2016-04-20 DIAGNOSIS — S0093XD Contusion of unspecified part of head, subsequent encounter: Secondary | ICD-10-CM | POA: Diagnosis not present

## 2016-04-20 DIAGNOSIS — N390 Urinary tract infection, site not specified: Secondary | ICD-10-CM | POA: Diagnosis not present

## 2016-04-22 DIAGNOSIS — M9904 Segmental and somatic dysfunction of sacral region: Secondary | ICD-10-CM | POA: Diagnosis not present

## 2016-04-22 DIAGNOSIS — M9903 Segmental and somatic dysfunction of lumbar region: Secondary | ICD-10-CM | POA: Diagnosis not present

## 2016-04-22 DIAGNOSIS — M791 Myalgia: Secondary | ICD-10-CM | POA: Diagnosis not present

## 2016-04-22 DIAGNOSIS — M5136 Other intervertebral disc degeneration, lumbar region: Secondary | ICD-10-CM | POA: Diagnosis not present

## 2016-05-06 DIAGNOSIS — M791 Myalgia: Secondary | ICD-10-CM | POA: Diagnosis not present

## 2016-05-06 DIAGNOSIS — M5136 Other intervertebral disc degeneration, lumbar region: Secondary | ICD-10-CM | POA: Diagnosis not present

## 2016-05-06 DIAGNOSIS — M9904 Segmental and somatic dysfunction of sacral region: Secondary | ICD-10-CM | POA: Diagnosis not present

## 2016-05-06 DIAGNOSIS — M9903 Segmental and somatic dysfunction of lumbar region: Secondary | ICD-10-CM | POA: Diagnosis not present

## 2016-05-20 DIAGNOSIS — N3281 Overactive bladder: Secondary | ICD-10-CM | POA: Diagnosis not present

## 2016-05-20 DIAGNOSIS — M9904 Segmental and somatic dysfunction of sacral region: Secondary | ICD-10-CM | POA: Diagnosis not present

## 2016-05-20 DIAGNOSIS — M9903 Segmental and somatic dysfunction of lumbar region: Secondary | ICD-10-CM | POA: Diagnosis not present

## 2016-05-20 DIAGNOSIS — M791 Myalgia: Secondary | ICD-10-CM | POA: Diagnosis not present

## 2016-05-20 DIAGNOSIS — M5136 Other intervertebral disc degeneration, lumbar region: Secondary | ICD-10-CM | POA: Diagnosis not present

## 2016-06-01 DIAGNOSIS — M6281 Muscle weakness (generalized): Secondary | ICD-10-CM | POA: Diagnosis not present

## 2016-06-01 DIAGNOSIS — R2689 Other abnormalities of gait and mobility: Secondary | ICD-10-CM | POA: Diagnosis not present

## 2016-06-03 DIAGNOSIS — R2689 Other abnormalities of gait and mobility: Secondary | ICD-10-CM | POA: Diagnosis not present

## 2016-06-03 DIAGNOSIS — M6281 Muscle weakness (generalized): Secondary | ICD-10-CM | POA: Diagnosis not present

## 2016-06-05 DIAGNOSIS — R2689 Other abnormalities of gait and mobility: Secondary | ICD-10-CM | POA: Diagnosis not present

## 2016-06-05 DIAGNOSIS — M6281 Muscle weakness (generalized): Secondary | ICD-10-CM | POA: Diagnosis not present

## 2016-06-08 DIAGNOSIS — R2689 Other abnormalities of gait and mobility: Secondary | ICD-10-CM | POA: Diagnosis not present

## 2016-06-08 DIAGNOSIS — M6281 Muscle weakness (generalized): Secondary | ICD-10-CM | POA: Diagnosis not present

## 2016-06-10 DIAGNOSIS — M545 Low back pain: Secondary | ICD-10-CM | POA: Diagnosis not present

## 2016-06-10 DIAGNOSIS — R2689 Other abnormalities of gait and mobility: Secondary | ICD-10-CM | POA: Diagnosis not present

## 2016-06-10 DIAGNOSIS — G8929 Other chronic pain: Secondary | ICD-10-CM | POA: Diagnosis not present

## 2016-06-10 DIAGNOSIS — I10 Essential (primary) hypertension: Secondary | ICD-10-CM | POA: Diagnosis not present

## 2016-06-10 DIAGNOSIS — M6281 Muscle weakness (generalized): Secondary | ICD-10-CM | POA: Diagnosis not present

## 2016-06-10 DIAGNOSIS — M4005 Postural kyphosis, thoracolumbar region: Secondary | ICD-10-CM | POA: Diagnosis not present

## 2016-06-10 DIAGNOSIS — E7800 Pure hypercholesterolemia, unspecified: Secondary | ICD-10-CM | POA: Diagnosis not present

## 2016-06-12 DIAGNOSIS — B351 Tinea unguium: Secondary | ICD-10-CM | POA: Diagnosis not present

## 2016-06-12 DIAGNOSIS — M79674 Pain in right toe(s): Secondary | ICD-10-CM | POA: Diagnosis not present

## 2016-06-12 DIAGNOSIS — M6281 Muscle weakness (generalized): Secondary | ICD-10-CM | POA: Diagnosis not present

## 2016-06-12 DIAGNOSIS — R2689 Other abnormalities of gait and mobility: Secondary | ICD-10-CM | POA: Diagnosis not present

## 2016-06-12 DIAGNOSIS — M79675 Pain in left toe(s): Secondary | ICD-10-CM | POA: Diagnosis not present

## 2016-06-12 DIAGNOSIS — L603 Nail dystrophy: Secondary | ICD-10-CM | POA: Diagnosis not present

## 2016-06-12 DIAGNOSIS — L852 Keratosis punctata (palmaris et plantaris): Secondary | ICD-10-CM | POA: Diagnosis not present

## 2016-06-12 DIAGNOSIS — I70203 Unspecified atherosclerosis of native arteries of extremities, bilateral legs: Secondary | ICD-10-CM | POA: Diagnosis not present

## 2016-06-15 DIAGNOSIS — H2513 Age-related nuclear cataract, bilateral: Secondary | ICD-10-CM | POA: Diagnosis not present

## 2016-06-15 DIAGNOSIS — M6281 Muscle weakness (generalized): Secondary | ICD-10-CM | POA: Diagnosis not present

## 2016-06-15 DIAGNOSIS — H25043 Posterior subcapsular polar age-related cataract, bilateral: Secondary | ICD-10-CM | POA: Diagnosis not present

## 2016-06-15 DIAGNOSIS — R2689 Other abnormalities of gait and mobility: Secondary | ICD-10-CM | POA: Diagnosis not present

## 2016-06-17 DIAGNOSIS — M9904 Segmental and somatic dysfunction of sacral region: Secondary | ICD-10-CM | POA: Diagnosis not present

## 2016-06-17 DIAGNOSIS — R2689 Other abnormalities of gait and mobility: Secondary | ICD-10-CM | POA: Diagnosis not present

## 2016-06-17 DIAGNOSIS — M791 Myalgia: Secondary | ICD-10-CM | POA: Diagnosis not present

## 2016-06-17 DIAGNOSIS — M6281 Muscle weakness (generalized): Secondary | ICD-10-CM | POA: Diagnosis not present

## 2016-06-17 DIAGNOSIS — M5136 Other intervertebral disc degeneration, lumbar region: Secondary | ICD-10-CM | POA: Diagnosis not present

## 2016-06-17 DIAGNOSIS — M9903 Segmental and somatic dysfunction of lumbar region: Secondary | ICD-10-CM | POA: Diagnosis not present

## 2016-06-19 DIAGNOSIS — M6281 Muscle weakness (generalized): Secondary | ICD-10-CM | POA: Diagnosis not present

## 2016-06-19 DIAGNOSIS — R2689 Other abnormalities of gait and mobility: Secondary | ICD-10-CM | POA: Diagnosis not present

## 2016-06-22 DIAGNOSIS — R2689 Other abnormalities of gait and mobility: Secondary | ICD-10-CM | POA: Diagnosis not present

## 2016-06-22 DIAGNOSIS — M6281 Muscle weakness (generalized): Secondary | ICD-10-CM | POA: Diagnosis not present

## 2016-06-26 DIAGNOSIS — R2689 Other abnormalities of gait and mobility: Secondary | ICD-10-CM | POA: Diagnosis not present

## 2016-06-26 DIAGNOSIS — M6281 Muscle weakness (generalized): Secondary | ICD-10-CM | POA: Diagnosis not present

## 2016-06-29 DIAGNOSIS — M6281 Muscle weakness (generalized): Secondary | ICD-10-CM | POA: Diagnosis not present

## 2016-06-29 DIAGNOSIS — R2689 Other abnormalities of gait and mobility: Secondary | ICD-10-CM | POA: Diagnosis not present

## 2016-07-01 DIAGNOSIS — M6281 Muscle weakness (generalized): Secondary | ICD-10-CM | POA: Diagnosis not present

## 2016-07-01 DIAGNOSIS — R2689 Other abnormalities of gait and mobility: Secondary | ICD-10-CM | POA: Diagnosis not present

## 2016-07-03 DIAGNOSIS — M6281 Muscle weakness (generalized): Secondary | ICD-10-CM | POA: Diagnosis not present

## 2016-07-03 DIAGNOSIS — R2689 Other abnormalities of gait and mobility: Secondary | ICD-10-CM | POA: Diagnosis not present

## 2016-07-07 DIAGNOSIS — E7800 Pure hypercholesterolemia, unspecified: Secondary | ICD-10-CM | POA: Diagnosis not present

## 2016-07-07 DIAGNOSIS — I8393 Asymptomatic varicose veins of bilateral lower extremities: Secondary | ICD-10-CM | POA: Diagnosis not present

## 2016-07-07 DIAGNOSIS — L819 Disorder of pigmentation, unspecified: Secondary | ICD-10-CM | POA: Diagnosis not present

## 2016-07-07 DIAGNOSIS — I10 Essential (primary) hypertension: Secondary | ICD-10-CM | POA: Diagnosis not present

## 2016-07-07 DIAGNOSIS — Z23 Encounter for immunization: Secondary | ICD-10-CM | POA: Diagnosis not present

## 2016-07-07 DIAGNOSIS — E785 Hyperlipidemia, unspecified: Secondary | ICD-10-CM | POA: Diagnosis not present

## 2016-07-07 DIAGNOSIS — F411 Generalized anxiety disorder: Secondary | ICD-10-CM | POA: Diagnosis not present

## 2016-07-07 DIAGNOSIS — M545 Low back pain: Secondary | ICD-10-CM | POA: Diagnosis not present

## 2016-07-08 DIAGNOSIS — M4005 Postural kyphosis, thoracolumbar region: Secondary | ICD-10-CM | POA: Diagnosis not present

## 2016-07-08 DIAGNOSIS — M545 Low back pain: Secondary | ICD-10-CM | POA: Diagnosis not present

## 2016-07-08 DIAGNOSIS — R262 Difficulty in walking, not elsewhere classified: Secondary | ICD-10-CM | POA: Diagnosis not present

## 2016-07-08 DIAGNOSIS — M419 Scoliosis, unspecified: Secondary | ICD-10-CM | POA: Diagnosis not present

## 2016-07-15 DIAGNOSIS — M791 Myalgia: Secondary | ICD-10-CM | POA: Diagnosis not present

## 2016-07-15 DIAGNOSIS — M9904 Segmental and somatic dysfunction of sacral region: Secondary | ICD-10-CM | POA: Diagnosis not present

## 2016-07-15 DIAGNOSIS — I872 Venous insufficiency (chronic) (peripheral): Secondary | ICD-10-CM | POA: Diagnosis not present

## 2016-07-15 DIAGNOSIS — M25862 Other specified joint disorders, left knee: Secondary | ICD-10-CM | POA: Diagnosis not present

## 2016-07-15 DIAGNOSIS — M9903 Segmental and somatic dysfunction of lumbar region: Secondary | ICD-10-CM | POA: Diagnosis not present

## 2016-07-15 DIAGNOSIS — I8390 Asymptomatic varicose veins of unspecified lower extremity: Secondary | ICD-10-CM | POA: Diagnosis not present

## 2016-07-15 DIAGNOSIS — M5136 Other intervertebral disc degeneration, lumbar region: Secondary | ICD-10-CM | POA: Diagnosis not present

## 2016-07-15 DIAGNOSIS — I83893 Varicose veins of bilateral lower extremities with other complications: Secondary | ICD-10-CM | POA: Diagnosis not present

## 2016-07-15 DIAGNOSIS — R609 Edema, unspecified: Secondary | ICD-10-CM | POA: Diagnosis not present

## 2016-07-16 DIAGNOSIS — M6281 Muscle weakness (generalized): Secondary | ICD-10-CM | POA: Diagnosis not present

## 2016-07-16 DIAGNOSIS — R2689 Other abnormalities of gait and mobility: Secondary | ICD-10-CM | POA: Diagnosis not present

## 2016-07-20 DIAGNOSIS — M6281 Muscle weakness (generalized): Secondary | ICD-10-CM | POA: Diagnosis not present

## 2016-07-20 DIAGNOSIS — R2689 Other abnormalities of gait and mobility: Secondary | ICD-10-CM | POA: Diagnosis not present

## 2016-07-22 DIAGNOSIS — R2689 Other abnormalities of gait and mobility: Secondary | ICD-10-CM | POA: Diagnosis not present

## 2016-07-22 DIAGNOSIS — I739 Peripheral vascular disease, unspecified: Secondary | ICD-10-CM | POA: Diagnosis not present

## 2016-07-22 DIAGNOSIS — M6281 Muscle weakness (generalized): Secondary | ICD-10-CM | POA: Diagnosis not present

## 2016-07-24 DIAGNOSIS — M6281 Muscle weakness (generalized): Secondary | ICD-10-CM | POA: Diagnosis not present

## 2016-07-24 DIAGNOSIS — R2689 Other abnormalities of gait and mobility: Secondary | ICD-10-CM | POA: Diagnosis not present

## 2016-07-28 DIAGNOSIS — M6281 Muscle weakness (generalized): Secondary | ICD-10-CM | POA: Diagnosis not present

## 2016-07-28 DIAGNOSIS — R2689 Other abnormalities of gait and mobility: Secondary | ICD-10-CM | POA: Diagnosis not present

## 2016-07-29 DIAGNOSIS — I70201 Unspecified atherosclerosis of native arteries of extremities, right leg: Secondary | ICD-10-CM | POA: Diagnosis not present

## 2016-07-29 DIAGNOSIS — I70202 Unspecified atherosclerosis of native arteries of extremities, left leg: Secondary | ICD-10-CM | POA: Diagnosis not present

## 2016-07-29 DIAGNOSIS — I739 Peripheral vascular disease, unspecified: Secondary | ICD-10-CM | POA: Diagnosis not present

## 2016-08-04 DIAGNOSIS — H269 Unspecified cataract: Secondary | ICD-10-CM | POA: Diagnosis not present

## 2016-08-04 DIAGNOSIS — H2511 Age-related nuclear cataract, right eye: Secondary | ICD-10-CM | POA: Diagnosis not present

## 2016-08-07 DIAGNOSIS — I70203 Unspecified atherosclerosis of native arteries of extremities, bilateral legs: Secondary | ICD-10-CM | POA: Diagnosis not present

## 2016-08-07 DIAGNOSIS — I872 Venous insufficiency (chronic) (peripheral): Secondary | ICD-10-CM | POA: Diagnosis not present

## 2016-08-07 DIAGNOSIS — M7120 Synovial cyst of popliteal space [Baker], unspecified knee: Secondary | ICD-10-CM | POA: Diagnosis not present

## 2016-08-07 DIAGNOSIS — I10 Essential (primary) hypertension: Secondary | ICD-10-CM | POA: Diagnosis not present

## 2016-08-10 DIAGNOSIS — R2689 Other abnormalities of gait and mobility: Secondary | ICD-10-CM | POA: Diagnosis not present

## 2016-08-10 DIAGNOSIS — M545 Low back pain: Secondary | ICD-10-CM | POA: Diagnosis not present

## 2016-08-10 DIAGNOSIS — M6281 Muscle weakness (generalized): Secondary | ICD-10-CM | POA: Diagnosis not present

## 2016-08-11 DIAGNOSIS — H25042 Posterior subcapsular polar age-related cataract, left eye: Secondary | ICD-10-CM | POA: Diagnosis not present

## 2016-08-11 DIAGNOSIS — H2512 Age-related nuclear cataract, left eye: Secondary | ICD-10-CM | POA: Diagnosis not present

## 2016-08-24 DIAGNOSIS — M6281 Muscle weakness (generalized): Secondary | ICD-10-CM | POA: Diagnosis not present

## 2016-08-24 DIAGNOSIS — R2689 Other abnormalities of gait and mobility: Secondary | ICD-10-CM | POA: Diagnosis not present

## 2016-08-26 DIAGNOSIS — M9904 Segmental and somatic dysfunction of sacral region: Secondary | ICD-10-CM | POA: Diagnosis not present

## 2016-08-26 DIAGNOSIS — M6281 Muscle weakness (generalized): Secondary | ICD-10-CM | POA: Diagnosis not present

## 2016-08-26 DIAGNOSIS — M5136 Other intervertebral disc degeneration, lumbar region: Secondary | ICD-10-CM | POA: Diagnosis not present

## 2016-08-26 DIAGNOSIS — R2689 Other abnormalities of gait and mobility: Secondary | ICD-10-CM | POA: Diagnosis not present

## 2016-08-26 DIAGNOSIS — M9903 Segmental and somatic dysfunction of lumbar region: Secondary | ICD-10-CM | POA: Diagnosis not present

## 2016-08-26 DIAGNOSIS — M791 Myalgia: Secondary | ICD-10-CM | POA: Diagnosis not present

## 2016-08-28 DIAGNOSIS — R2689 Other abnormalities of gait and mobility: Secondary | ICD-10-CM | POA: Diagnosis not present

## 2016-08-28 DIAGNOSIS — M6281 Muscle weakness (generalized): Secondary | ICD-10-CM | POA: Diagnosis not present

## 2016-09-04 DIAGNOSIS — M6281 Muscle weakness (generalized): Secondary | ICD-10-CM | POA: Diagnosis not present

## 2016-09-04 DIAGNOSIS — R2689 Other abnormalities of gait and mobility: Secondary | ICD-10-CM | POA: Diagnosis not present

## 2016-09-07 DIAGNOSIS — M6281 Muscle weakness (generalized): Secondary | ICD-10-CM | POA: Diagnosis not present

## 2016-09-07 DIAGNOSIS — R2689 Other abnormalities of gait and mobility: Secondary | ICD-10-CM | POA: Diagnosis not present

## 2016-09-09 DIAGNOSIS — R2689 Other abnormalities of gait and mobility: Secondary | ICD-10-CM | POA: Diagnosis not present

## 2016-09-09 DIAGNOSIS — M6281 Muscle weakness (generalized): Secondary | ICD-10-CM | POA: Diagnosis not present

## 2016-09-11 DIAGNOSIS — M6281 Muscle weakness (generalized): Secondary | ICD-10-CM | POA: Diagnosis not present

## 2016-09-11 DIAGNOSIS — R2689 Other abnormalities of gait and mobility: Secondary | ICD-10-CM | POA: Diagnosis not present

## 2016-09-16 DIAGNOSIS — R2689 Other abnormalities of gait and mobility: Secondary | ICD-10-CM | POA: Diagnosis not present

## 2016-09-16 DIAGNOSIS — M6281 Muscle weakness (generalized): Secondary | ICD-10-CM | POA: Diagnosis not present

## 2016-09-18 DIAGNOSIS — M9904 Segmental and somatic dysfunction of sacral region: Secondary | ICD-10-CM | POA: Diagnosis not present

## 2016-09-18 DIAGNOSIS — M5136 Other intervertebral disc degeneration, lumbar region: Secondary | ICD-10-CM | POA: Diagnosis not present

## 2016-09-18 DIAGNOSIS — M6281 Muscle weakness (generalized): Secondary | ICD-10-CM | POA: Diagnosis not present

## 2016-09-18 DIAGNOSIS — M791 Myalgia: Secondary | ICD-10-CM | POA: Diagnosis not present

## 2016-09-18 DIAGNOSIS — R2689 Other abnormalities of gait and mobility: Secondary | ICD-10-CM | POA: Diagnosis not present

## 2016-09-18 DIAGNOSIS — M9903 Segmental and somatic dysfunction of lumbar region: Secondary | ICD-10-CM | POA: Diagnosis not present

## 2016-09-21 DIAGNOSIS — M810 Age-related osteoporosis without current pathological fracture: Secondary | ICD-10-CM | POA: Diagnosis not present

## 2016-09-23 DIAGNOSIS — M810 Age-related osteoporosis without current pathological fracture: Secondary | ICD-10-CM | POA: Diagnosis not present

## 2016-10-01 DIAGNOSIS — R300 Dysuria: Secondary | ICD-10-CM | POA: Diagnosis not present

## 2016-10-05 DIAGNOSIS — N3941 Urge incontinence: Secondary | ICD-10-CM | POA: Diagnosis not present

## 2016-10-05 DIAGNOSIS — N3281 Overactive bladder: Secondary | ICD-10-CM | POA: Diagnosis not present

## 2016-10-09 DIAGNOSIS — N390 Urinary tract infection, site not specified: Secondary | ICD-10-CM | POA: Diagnosis not present

## 2016-10-12 DIAGNOSIS — M810 Age-related osteoporosis without current pathological fracture: Secondary | ICD-10-CM | POA: Diagnosis not present

## 2016-10-28 DIAGNOSIS — M9903 Segmental and somatic dysfunction of lumbar region: Secondary | ICD-10-CM | POA: Diagnosis not present

## 2016-10-28 DIAGNOSIS — M5136 Other intervertebral disc degeneration, lumbar region: Secondary | ICD-10-CM | POA: Diagnosis not present

## 2016-10-28 DIAGNOSIS — M791 Myalgia: Secondary | ICD-10-CM | POA: Diagnosis not present

## 2016-10-28 DIAGNOSIS — M9904 Segmental and somatic dysfunction of sacral region: Secondary | ICD-10-CM | POA: Diagnosis not present

## 2016-11-06 DIAGNOSIS — L603 Nail dystrophy: Secondary | ICD-10-CM | POA: Diagnosis not present

## 2016-11-06 DIAGNOSIS — B351 Tinea unguium: Secondary | ICD-10-CM | POA: Diagnosis not present

## 2016-11-06 DIAGNOSIS — M25562 Pain in left knee: Secondary | ICD-10-CM | POA: Diagnosis not present

## 2016-11-06 DIAGNOSIS — D492 Neoplasm of unspecified behavior of bone, soft tissue, and skin: Secondary | ICD-10-CM | POA: Diagnosis not present

## 2016-11-06 DIAGNOSIS — M79662 Pain in left lower leg: Secondary | ICD-10-CM | POA: Diagnosis not present

## 2016-11-06 DIAGNOSIS — M79675 Pain in left toe(s): Secondary | ICD-10-CM | POA: Diagnosis not present

## 2016-11-06 DIAGNOSIS — M79674 Pain in right toe(s): Secondary | ICD-10-CM | POA: Diagnosis not present

## 2016-11-06 DIAGNOSIS — I70203 Unspecified atherosclerosis of native arteries of extremities, bilateral legs: Secondary | ICD-10-CM | POA: Diagnosis not present

## 2016-11-09 DIAGNOSIS — M11262 Other chondrocalcinosis, left knee: Secondary | ICD-10-CM | POA: Diagnosis not present

## 2016-11-09 DIAGNOSIS — M25552 Pain in left hip: Secondary | ICD-10-CM | POA: Diagnosis not present

## 2016-11-09 DIAGNOSIS — G8929 Other chronic pain: Secondary | ICD-10-CM | POA: Diagnosis not present

## 2016-11-09 DIAGNOSIS — Z23 Encounter for immunization: Secondary | ICD-10-CM | POA: Diagnosis not present

## 2016-11-09 DIAGNOSIS — M25562 Pain in left knee: Secondary | ICD-10-CM | POA: Diagnosis not present

## 2016-11-09 DIAGNOSIS — I10 Essential (primary) hypertension: Secondary | ICD-10-CM | POA: Diagnosis not present

## 2016-11-10 DIAGNOSIS — D492 Neoplasm of unspecified behavior of bone, soft tissue, and skin: Secondary | ICD-10-CM | POA: Diagnosis not present

## 2016-11-13 DIAGNOSIS — I10 Essential (primary) hypertension: Secondary | ICD-10-CM | POA: Diagnosis not present

## 2016-11-13 DIAGNOSIS — S8392XA Sprain of unspecified site of left knee, initial encounter: Secondary | ICD-10-CM | POA: Diagnosis not present

## 2016-11-13 DIAGNOSIS — M6281 Muscle weakness (generalized): Secondary | ICD-10-CM | POA: Diagnosis not present

## 2016-11-13 DIAGNOSIS — M25562 Pain in left knee: Secondary | ICD-10-CM | POA: Diagnosis not present

## 2016-11-13 DIAGNOSIS — R269 Unspecified abnormalities of gait and mobility: Secondary | ICD-10-CM | POA: Diagnosis not present

## 2016-11-13 DIAGNOSIS — M1612 Unilateral primary osteoarthritis, left hip: Secondary | ICD-10-CM | POA: Diagnosis not present

## 2016-11-13 DIAGNOSIS — M1712 Unilateral primary osteoarthritis, left knee: Secondary | ICD-10-CM | POA: Diagnosis not present

## 2016-11-13 DIAGNOSIS — S79912A Unspecified injury of left hip, initial encounter: Secondary | ICD-10-CM | POA: Diagnosis not present

## 2016-11-13 DIAGNOSIS — R2681 Unsteadiness on feet: Secondary | ICD-10-CM | POA: Diagnosis not present

## 2016-11-17 DIAGNOSIS — R2681 Unsteadiness on feet: Secondary | ICD-10-CM | POA: Diagnosis not present

## 2016-11-17 DIAGNOSIS — S8392XA Sprain of unspecified site of left knee, initial encounter: Secondary | ICD-10-CM | POA: Diagnosis not present

## 2016-11-17 DIAGNOSIS — I10 Essential (primary) hypertension: Secondary | ICD-10-CM | POA: Diagnosis not present

## 2016-11-17 DIAGNOSIS — S79912A Unspecified injury of left hip, initial encounter: Secondary | ICD-10-CM | POA: Diagnosis not present

## 2016-11-17 DIAGNOSIS — R269 Unspecified abnormalities of gait and mobility: Secondary | ICD-10-CM | POA: Diagnosis not present

## 2016-11-17 DIAGNOSIS — M1612 Unilateral primary osteoarthritis, left hip: Secondary | ICD-10-CM | POA: Diagnosis not present

## 2016-11-17 DIAGNOSIS — M6281 Muscle weakness (generalized): Secondary | ICD-10-CM | POA: Diagnosis not present

## 2016-11-17 DIAGNOSIS — M1712 Unilateral primary osteoarthritis, left knee: Secondary | ICD-10-CM | POA: Diagnosis not present

## 2016-11-17 DIAGNOSIS — M25562 Pain in left knee: Secondary | ICD-10-CM | POA: Diagnosis not present

## 2016-11-19 DIAGNOSIS — S79912A Unspecified injury of left hip, initial encounter: Secondary | ICD-10-CM | POA: Diagnosis not present

## 2016-11-19 DIAGNOSIS — S8392XA Sprain of unspecified site of left knee, initial encounter: Secondary | ICD-10-CM | POA: Diagnosis not present

## 2016-11-19 DIAGNOSIS — M1712 Unilateral primary osteoarthritis, left knee: Secondary | ICD-10-CM | POA: Diagnosis not present

## 2016-11-19 DIAGNOSIS — M6281 Muscle weakness (generalized): Secondary | ICD-10-CM | POA: Diagnosis not present

## 2016-11-19 DIAGNOSIS — M1612 Unilateral primary osteoarthritis, left hip: Secondary | ICD-10-CM | POA: Diagnosis not present

## 2016-11-19 DIAGNOSIS — M25562 Pain in left knee: Secondary | ICD-10-CM | POA: Diagnosis not present

## 2016-11-19 DIAGNOSIS — I10 Essential (primary) hypertension: Secondary | ICD-10-CM | POA: Diagnosis not present

## 2016-11-19 DIAGNOSIS — R2681 Unsteadiness on feet: Secondary | ICD-10-CM | POA: Diagnosis not present

## 2016-11-19 DIAGNOSIS — R269 Unspecified abnormalities of gait and mobility: Secondary | ICD-10-CM | POA: Diagnosis not present

## 2016-11-23 DIAGNOSIS — M25562 Pain in left knee: Secondary | ICD-10-CM | POA: Diagnosis not present

## 2016-11-23 DIAGNOSIS — M6281 Muscle weakness (generalized): Secondary | ICD-10-CM | POA: Diagnosis not present

## 2016-11-23 DIAGNOSIS — M1612 Unilateral primary osteoarthritis, left hip: Secondary | ICD-10-CM | POA: Diagnosis not present

## 2016-11-23 DIAGNOSIS — S8392XA Sprain of unspecified site of left knee, initial encounter: Secondary | ICD-10-CM | POA: Diagnosis not present

## 2016-11-23 DIAGNOSIS — M1712 Unilateral primary osteoarthritis, left knee: Secondary | ICD-10-CM | POA: Diagnosis not present

## 2016-11-23 DIAGNOSIS — I10 Essential (primary) hypertension: Secondary | ICD-10-CM | POA: Diagnosis not present

## 2016-11-23 DIAGNOSIS — R2681 Unsteadiness on feet: Secondary | ICD-10-CM | POA: Diagnosis not present

## 2016-11-23 DIAGNOSIS — R269 Unspecified abnormalities of gait and mobility: Secondary | ICD-10-CM | POA: Diagnosis not present

## 2016-11-23 DIAGNOSIS — S79912A Unspecified injury of left hip, initial encounter: Secondary | ICD-10-CM | POA: Diagnosis not present

## 2016-11-27 DIAGNOSIS — I10 Essential (primary) hypertension: Secondary | ICD-10-CM | POA: Diagnosis not present

## 2016-11-27 DIAGNOSIS — M6281 Muscle weakness (generalized): Secondary | ICD-10-CM | POA: Diagnosis not present

## 2016-11-27 DIAGNOSIS — M25562 Pain in left knee: Secondary | ICD-10-CM | POA: Diagnosis not present

## 2016-11-27 DIAGNOSIS — S8392XA Sprain of unspecified site of left knee, initial encounter: Secondary | ICD-10-CM | POA: Diagnosis not present

## 2016-11-27 DIAGNOSIS — M1712 Unilateral primary osteoarthritis, left knee: Secondary | ICD-10-CM | POA: Diagnosis not present

## 2016-11-27 DIAGNOSIS — S79912A Unspecified injury of left hip, initial encounter: Secondary | ICD-10-CM | POA: Diagnosis not present

## 2016-11-27 DIAGNOSIS — R2681 Unsteadiness on feet: Secondary | ICD-10-CM | POA: Diagnosis not present

## 2016-11-27 DIAGNOSIS — M1612 Unilateral primary osteoarthritis, left hip: Secondary | ICD-10-CM | POA: Diagnosis not present

## 2016-11-27 DIAGNOSIS — R269 Unspecified abnormalities of gait and mobility: Secondary | ICD-10-CM | POA: Diagnosis not present

## 2016-11-29 DIAGNOSIS — R2681 Unsteadiness on feet: Secondary | ICD-10-CM | POA: Diagnosis not present

## 2016-11-29 DIAGNOSIS — S79912A Unspecified injury of left hip, initial encounter: Secondary | ICD-10-CM | POA: Diagnosis not present

## 2016-11-29 DIAGNOSIS — M25562 Pain in left knee: Secondary | ICD-10-CM | POA: Diagnosis not present

## 2016-11-29 DIAGNOSIS — S8392XA Sprain of unspecified site of left knee, initial encounter: Secondary | ICD-10-CM | POA: Diagnosis not present

## 2016-11-29 DIAGNOSIS — M1712 Unilateral primary osteoarthritis, left knee: Secondary | ICD-10-CM | POA: Diagnosis not present

## 2016-11-29 DIAGNOSIS — R269 Unspecified abnormalities of gait and mobility: Secondary | ICD-10-CM | POA: Diagnosis not present

## 2016-11-29 DIAGNOSIS — M6281 Muscle weakness (generalized): Secondary | ICD-10-CM | POA: Diagnosis not present

## 2016-11-29 DIAGNOSIS — I10 Essential (primary) hypertension: Secondary | ICD-10-CM | POA: Diagnosis not present

## 2016-11-29 DIAGNOSIS — M1612 Unilateral primary osteoarthritis, left hip: Secondary | ICD-10-CM | POA: Diagnosis not present

## 2016-12-02 DIAGNOSIS — R2681 Unsteadiness on feet: Secondary | ICD-10-CM | POA: Diagnosis not present

## 2016-12-02 DIAGNOSIS — M6281 Muscle weakness (generalized): Secondary | ICD-10-CM | POA: Diagnosis not present

## 2016-12-02 DIAGNOSIS — M25562 Pain in left knee: Secondary | ICD-10-CM | POA: Diagnosis not present

## 2016-12-02 DIAGNOSIS — S79912A Unspecified injury of left hip, initial encounter: Secondary | ICD-10-CM | POA: Diagnosis not present

## 2016-12-02 DIAGNOSIS — R269 Unspecified abnormalities of gait and mobility: Secondary | ICD-10-CM | POA: Diagnosis not present

## 2016-12-02 DIAGNOSIS — S8392XA Sprain of unspecified site of left knee, initial encounter: Secondary | ICD-10-CM | POA: Diagnosis not present

## 2016-12-02 DIAGNOSIS — M1612 Unilateral primary osteoarthritis, left hip: Secondary | ICD-10-CM | POA: Diagnosis not present

## 2016-12-02 DIAGNOSIS — M1712 Unilateral primary osteoarthritis, left knee: Secondary | ICD-10-CM | POA: Diagnosis not present

## 2016-12-02 DIAGNOSIS — I10 Essential (primary) hypertension: Secondary | ICD-10-CM | POA: Diagnosis not present

## 2016-12-08 DIAGNOSIS — I10 Essential (primary) hypertension: Secondary | ICD-10-CM | POA: Diagnosis not present

## 2016-12-08 DIAGNOSIS — R269 Unspecified abnormalities of gait and mobility: Secondary | ICD-10-CM | POA: Diagnosis not present

## 2016-12-08 DIAGNOSIS — M1612 Unilateral primary osteoarthritis, left hip: Secondary | ICD-10-CM | POA: Diagnosis not present

## 2016-12-08 DIAGNOSIS — S8392XA Sprain of unspecified site of left knee, initial encounter: Secondary | ICD-10-CM | POA: Diagnosis not present

## 2016-12-08 DIAGNOSIS — R2681 Unsteadiness on feet: Secondary | ICD-10-CM | POA: Diagnosis not present

## 2016-12-08 DIAGNOSIS — M6281 Muscle weakness (generalized): Secondary | ICD-10-CM | POA: Diagnosis not present

## 2016-12-08 DIAGNOSIS — S79912A Unspecified injury of left hip, initial encounter: Secondary | ICD-10-CM | POA: Diagnosis not present

## 2016-12-08 DIAGNOSIS — M1712 Unilateral primary osteoarthritis, left knee: Secondary | ICD-10-CM | POA: Diagnosis not present

## 2016-12-08 DIAGNOSIS — M25562 Pain in left knee: Secondary | ICD-10-CM | POA: Diagnosis not present

## 2016-12-25 DIAGNOSIS — M9903 Segmental and somatic dysfunction of lumbar region: Secondary | ICD-10-CM | POA: Diagnosis not present

## 2016-12-25 DIAGNOSIS — M7918 Myalgia, other site: Secondary | ICD-10-CM | POA: Diagnosis not present

## 2016-12-25 DIAGNOSIS — M5136 Other intervertebral disc degeneration, lumbar region: Secondary | ICD-10-CM | POA: Diagnosis not present

## 2016-12-25 DIAGNOSIS — M9904 Segmental and somatic dysfunction of sacral region: Secondary | ICD-10-CM | POA: Diagnosis not present

## 2017-01-04 DIAGNOSIS — N3281 Overactive bladder: Secondary | ICD-10-CM | POA: Diagnosis not present

## 2017-01-18 DIAGNOSIS — M7918 Myalgia, other site: Secondary | ICD-10-CM | POA: Diagnosis not present

## 2017-01-18 DIAGNOSIS — M9903 Segmental and somatic dysfunction of lumbar region: Secondary | ICD-10-CM | POA: Diagnosis not present

## 2017-01-18 DIAGNOSIS — M5136 Other intervertebral disc degeneration, lumbar region: Secondary | ICD-10-CM | POA: Diagnosis not present

## 2017-01-18 DIAGNOSIS — M9904 Segmental and somatic dysfunction of sacral region: Secondary | ICD-10-CM | POA: Diagnosis not present

## 2017-03-08 DIAGNOSIS — M9903 Segmental and somatic dysfunction of lumbar region: Secondary | ICD-10-CM | POA: Diagnosis not present

## 2017-03-08 DIAGNOSIS — M7918 Myalgia, other site: Secondary | ICD-10-CM | POA: Diagnosis not present

## 2017-03-08 DIAGNOSIS — M9904 Segmental and somatic dysfunction of sacral region: Secondary | ICD-10-CM | POA: Diagnosis not present

## 2017-03-08 DIAGNOSIS — M5136 Other intervertebral disc degeneration, lumbar region: Secondary | ICD-10-CM | POA: Diagnosis not present

## 2017-03-27 DIAGNOSIS — M810 Age-related osteoporosis without current pathological fracture: Secondary | ICD-10-CM | POA: Diagnosis not present

## 2017-04-05 DIAGNOSIS — M5417 Radiculopathy, lumbosacral region: Secondary | ICD-10-CM | POA: Diagnosis not present

## 2017-04-05 DIAGNOSIS — M5136 Other intervertebral disc degeneration, lumbar region: Secondary | ICD-10-CM | POA: Diagnosis not present

## 2017-04-05 DIAGNOSIS — M62830 Muscle spasm of back: Secondary | ICD-10-CM | POA: Diagnosis not present

## 2017-04-05 DIAGNOSIS — M9903 Segmental and somatic dysfunction of lumbar region: Secondary | ICD-10-CM | POA: Diagnosis not present

## 2017-04-07 DIAGNOSIS — I10 Essential (primary) hypertension: Secondary | ICD-10-CM | POA: Diagnosis not present

## 2017-04-07 DIAGNOSIS — I872 Venous insufficiency (chronic) (peripheral): Secondary | ICD-10-CM | POA: Diagnosis not present

## 2017-04-21 DIAGNOSIS — Z7983 Long term (current) use of bisphosphonates: Secondary | ICD-10-CM | POA: Diagnosis not present

## 2017-04-21 DIAGNOSIS — M810 Age-related osteoporosis without current pathological fracture: Secondary | ICD-10-CM | POA: Diagnosis not present

## 2017-04-21 DIAGNOSIS — Z79890 Hormone replacement therapy (postmenopausal): Secondary | ICD-10-CM | POA: Diagnosis not present

## 2017-06-07 DIAGNOSIS — E785 Hyperlipidemia, unspecified: Secondary | ICD-10-CM | POA: Diagnosis not present

## 2017-06-07 DIAGNOSIS — I872 Venous insufficiency (chronic) (peripheral): Secondary | ICD-10-CM | POA: Diagnosis not present

## 2017-06-07 DIAGNOSIS — I10 Essential (primary) hypertension: Secondary | ICD-10-CM | POA: Diagnosis not present

## 2017-06-07 DIAGNOSIS — Z6824 Body mass index (BMI) 24.0-24.9, adult: Secondary | ICD-10-CM | POA: Diagnosis not present

## 2017-06-07 DIAGNOSIS — I739 Peripheral vascular disease, unspecified: Secondary | ICD-10-CM | POA: Diagnosis not present

## 2017-06-14 DIAGNOSIS — N3281 Overactive bladder: Secondary | ICD-10-CM | POA: Diagnosis not present

## 2017-07-05 DIAGNOSIS — M62830 Muscle spasm of back: Secondary | ICD-10-CM | POA: Diagnosis not present

## 2017-07-05 DIAGNOSIS — M5136 Other intervertebral disc degeneration, lumbar region: Secondary | ICD-10-CM | POA: Diagnosis not present

## 2017-07-05 DIAGNOSIS — M5416 Radiculopathy, lumbar region: Secondary | ICD-10-CM | POA: Diagnosis not present

## 2017-07-05 DIAGNOSIS — M9903 Segmental and somatic dysfunction of lumbar region: Secondary | ICD-10-CM | POA: Diagnosis not present

## 2017-07-09 DIAGNOSIS — M2012 Hallux valgus (acquired), left foot: Secondary | ICD-10-CM | POA: Diagnosis not present

## 2017-07-09 DIAGNOSIS — L852 Keratosis punctata (palmaris et plantaris): Secondary | ICD-10-CM | POA: Diagnosis not present

## 2017-07-09 DIAGNOSIS — B351 Tinea unguium: Secondary | ICD-10-CM | POA: Diagnosis not present

## 2017-07-09 DIAGNOSIS — M79674 Pain in right toe(s): Secondary | ICD-10-CM | POA: Diagnosis not present

## 2017-07-09 DIAGNOSIS — I70203 Unspecified atherosclerosis of native arteries of extremities, bilateral legs: Secondary | ICD-10-CM | POA: Diagnosis not present

## 2017-07-09 DIAGNOSIS — M2011 Hallux valgus (acquired), right foot: Secondary | ICD-10-CM | POA: Diagnosis not present

## 2017-07-09 DIAGNOSIS — M79675 Pain in left toe(s): Secondary | ICD-10-CM | POA: Diagnosis not present

## 2017-07-12 DIAGNOSIS — G8929 Other chronic pain: Secondary | ICD-10-CM | POA: Diagnosis not present

## 2017-07-12 DIAGNOSIS — E785 Hyperlipidemia, unspecified: Secondary | ICD-10-CM | POA: Diagnosis not present

## 2017-07-12 DIAGNOSIS — I951 Orthostatic hypotension: Secondary | ICD-10-CM | POA: Diagnosis not present

## 2017-07-12 DIAGNOSIS — G629 Polyneuropathy, unspecified: Secondary | ICD-10-CM | POA: Diagnosis not present

## 2017-07-12 DIAGNOSIS — F4321 Adjustment disorder with depressed mood: Secondary | ICD-10-CM | POA: Diagnosis not present

## 2017-07-12 DIAGNOSIS — F419 Anxiety disorder, unspecified: Secondary | ICD-10-CM | POA: Diagnosis not present

## 2017-07-12 DIAGNOSIS — I10 Essential (primary) hypertension: Secondary | ICD-10-CM | POA: Diagnosis not present

## 2017-07-12 DIAGNOSIS — F329 Major depressive disorder, single episode, unspecified: Secondary | ICD-10-CM | POA: Diagnosis not present

## 2017-07-12 DIAGNOSIS — I25119 Atherosclerotic heart disease of native coronary artery with unspecified angina pectoris: Secondary | ICD-10-CM | POA: Diagnosis not present

## 2017-07-27 DIAGNOSIS — H353131 Nonexudative age-related macular degeneration, bilateral, early dry stage: Secondary | ICD-10-CM | POA: Diagnosis not present

## 2017-07-27 DIAGNOSIS — Z961 Presence of intraocular lens: Secondary | ICD-10-CM | POA: Diagnosis not present

## 2017-07-27 DIAGNOSIS — H26493 Other secondary cataract, bilateral: Secondary | ICD-10-CM | POA: Diagnosis not present

## 2017-07-27 DIAGNOSIS — H43811 Vitreous degeneration, right eye: Secondary | ICD-10-CM | POA: Diagnosis not present

## 2017-08-02 DIAGNOSIS — M5416 Radiculopathy, lumbar region: Secondary | ICD-10-CM | POA: Diagnosis not present

## 2017-08-02 DIAGNOSIS — M5136 Other intervertebral disc degeneration, lumbar region: Secondary | ICD-10-CM | POA: Diagnosis not present

## 2017-08-02 DIAGNOSIS — M9903 Segmental and somatic dysfunction of lumbar region: Secondary | ICD-10-CM | POA: Diagnosis not present

## 2017-08-02 DIAGNOSIS — M62830 Muscle spasm of back: Secondary | ICD-10-CM | POA: Diagnosis not present

## 2017-08-02 DIAGNOSIS — Z0000 Encounter for general adult medical examination without abnormal findings: Secondary | ICD-10-CM | POA: Diagnosis not present

## 2017-08-13 DIAGNOSIS — I10 Essential (primary) hypertension: Secondary | ICD-10-CM | POA: Diagnosis not present

## 2017-08-13 DIAGNOSIS — R531 Weakness: Secondary | ICD-10-CM | POA: Diagnosis not present

## 2017-08-13 DIAGNOSIS — M47899 Other spondylosis, site unspecified: Secondary | ICD-10-CM | POA: Diagnosis not present

## 2017-08-13 DIAGNOSIS — G629 Polyneuropathy, unspecified: Secondary | ICD-10-CM | POA: Diagnosis not present

## 2017-08-13 DIAGNOSIS — M170 Bilateral primary osteoarthritis of knee: Secondary | ICD-10-CM | POA: Diagnosis not present

## 2017-08-13 DIAGNOSIS — F419 Anxiety disorder, unspecified: Secondary | ICD-10-CM | POA: Diagnosis not present

## 2017-08-13 DIAGNOSIS — R2681 Unsteadiness on feet: Secondary | ICD-10-CM | POA: Diagnosis not present

## 2017-08-13 DIAGNOSIS — M810 Age-related osteoporosis without current pathological fracture: Secondary | ICD-10-CM | POA: Diagnosis not present

## 2017-08-13 DIAGNOSIS — N3941 Urge incontinence: Secondary | ICD-10-CM | POA: Diagnosis not present

## 2017-08-23 DIAGNOSIS — M810 Age-related osteoporosis without current pathological fracture: Secondary | ICD-10-CM | POA: Diagnosis not present

## 2017-08-23 DIAGNOSIS — I10 Essential (primary) hypertension: Secondary | ICD-10-CM | POA: Diagnosis not present

## 2017-08-23 DIAGNOSIS — M47899 Other spondylosis, site unspecified: Secondary | ICD-10-CM | POA: Diagnosis not present

## 2017-08-23 DIAGNOSIS — F419 Anxiety disorder, unspecified: Secondary | ICD-10-CM | POA: Diagnosis not present

## 2017-08-23 DIAGNOSIS — N3941 Urge incontinence: Secondary | ICD-10-CM | POA: Diagnosis not present

## 2017-08-23 DIAGNOSIS — G629 Polyneuropathy, unspecified: Secondary | ICD-10-CM | POA: Diagnosis not present

## 2017-08-23 DIAGNOSIS — R531 Weakness: Secondary | ICD-10-CM | POA: Diagnosis not present

## 2017-08-23 DIAGNOSIS — M170 Bilateral primary osteoarthritis of knee: Secondary | ICD-10-CM | POA: Diagnosis not present

## 2017-08-23 DIAGNOSIS — R2681 Unsteadiness on feet: Secondary | ICD-10-CM | POA: Diagnosis not present

## 2017-08-25 DIAGNOSIS — R2681 Unsteadiness on feet: Secondary | ICD-10-CM | POA: Diagnosis not present

## 2017-08-25 DIAGNOSIS — M47899 Other spondylosis, site unspecified: Secondary | ICD-10-CM | POA: Diagnosis not present

## 2017-08-25 DIAGNOSIS — G629 Polyneuropathy, unspecified: Secondary | ICD-10-CM | POA: Diagnosis not present

## 2017-08-25 DIAGNOSIS — M170 Bilateral primary osteoarthritis of knee: Secondary | ICD-10-CM | POA: Diagnosis not present

## 2017-08-25 DIAGNOSIS — F419 Anxiety disorder, unspecified: Secondary | ICD-10-CM | POA: Diagnosis not present

## 2017-08-25 DIAGNOSIS — R531 Weakness: Secondary | ICD-10-CM | POA: Diagnosis not present

## 2017-08-25 DIAGNOSIS — M810 Age-related osteoporosis without current pathological fracture: Secondary | ICD-10-CM | POA: Diagnosis not present

## 2017-08-25 DIAGNOSIS — I10 Essential (primary) hypertension: Secondary | ICD-10-CM | POA: Diagnosis not present

## 2017-08-25 DIAGNOSIS — N3941 Urge incontinence: Secondary | ICD-10-CM | POA: Diagnosis not present

## 2017-08-26 DIAGNOSIS — I10 Essential (primary) hypertension: Secondary | ICD-10-CM | POA: Diagnosis not present

## 2017-08-26 DIAGNOSIS — M47899 Other spondylosis, site unspecified: Secondary | ICD-10-CM | POA: Diagnosis not present

## 2017-08-26 DIAGNOSIS — G629 Polyneuropathy, unspecified: Secondary | ICD-10-CM | POA: Diagnosis not present

## 2017-08-26 DIAGNOSIS — M170 Bilateral primary osteoarthritis of knee: Secondary | ICD-10-CM | POA: Diagnosis not present

## 2017-08-26 DIAGNOSIS — F419 Anxiety disorder, unspecified: Secondary | ICD-10-CM | POA: Diagnosis not present

## 2017-08-26 DIAGNOSIS — N3941 Urge incontinence: Secondary | ICD-10-CM | POA: Diagnosis not present

## 2017-08-26 DIAGNOSIS — M810 Age-related osteoporosis without current pathological fracture: Secondary | ICD-10-CM | POA: Diagnosis not present

## 2017-08-26 DIAGNOSIS — R531 Weakness: Secondary | ICD-10-CM | POA: Diagnosis not present

## 2017-08-26 DIAGNOSIS — R2681 Unsteadiness on feet: Secondary | ICD-10-CM | POA: Diagnosis not present

## 2017-08-30 DIAGNOSIS — I10 Essential (primary) hypertension: Secondary | ICD-10-CM | POA: Diagnosis not present

## 2017-08-30 DIAGNOSIS — F419 Anxiety disorder, unspecified: Secondary | ICD-10-CM | POA: Diagnosis not present

## 2017-08-30 DIAGNOSIS — M810 Age-related osteoporosis without current pathological fracture: Secondary | ICD-10-CM | POA: Diagnosis not present

## 2017-08-30 DIAGNOSIS — M47899 Other spondylosis, site unspecified: Secondary | ICD-10-CM | POA: Diagnosis not present

## 2017-08-30 DIAGNOSIS — R531 Weakness: Secondary | ICD-10-CM | POA: Diagnosis not present

## 2017-08-30 DIAGNOSIS — M170 Bilateral primary osteoarthritis of knee: Secondary | ICD-10-CM | POA: Diagnosis not present

## 2017-08-30 DIAGNOSIS — R2681 Unsteadiness on feet: Secondary | ICD-10-CM | POA: Diagnosis not present

## 2017-08-30 DIAGNOSIS — N3941 Urge incontinence: Secondary | ICD-10-CM | POA: Diagnosis not present

## 2017-08-30 DIAGNOSIS — G629 Polyneuropathy, unspecified: Secondary | ICD-10-CM | POA: Diagnosis not present

## 2017-08-31 DIAGNOSIS — R300 Dysuria: Secondary | ICD-10-CM | POA: Diagnosis not present

## 2017-09-02 DIAGNOSIS — R2681 Unsteadiness on feet: Secondary | ICD-10-CM | POA: Diagnosis not present

## 2017-09-02 DIAGNOSIS — N3941 Urge incontinence: Secondary | ICD-10-CM | POA: Diagnosis not present

## 2017-09-02 DIAGNOSIS — I10 Essential (primary) hypertension: Secondary | ICD-10-CM | POA: Diagnosis not present

## 2017-09-02 DIAGNOSIS — M170 Bilateral primary osteoarthritis of knee: Secondary | ICD-10-CM | POA: Diagnosis not present

## 2017-09-02 DIAGNOSIS — F419 Anxiety disorder, unspecified: Secondary | ICD-10-CM | POA: Diagnosis not present

## 2017-09-02 DIAGNOSIS — G629 Polyneuropathy, unspecified: Secondary | ICD-10-CM | POA: Diagnosis not present

## 2017-09-02 DIAGNOSIS — M810 Age-related osteoporosis without current pathological fracture: Secondary | ICD-10-CM | POA: Diagnosis not present

## 2017-09-02 DIAGNOSIS — R531 Weakness: Secondary | ICD-10-CM | POA: Diagnosis not present

## 2017-09-02 DIAGNOSIS — M47899 Other spondylosis, site unspecified: Secondary | ICD-10-CM | POA: Diagnosis not present

## 2017-09-03 DIAGNOSIS — M545 Low back pain: Secondary | ICD-10-CM | POA: Diagnosis not present

## 2017-09-03 DIAGNOSIS — M170 Bilateral primary osteoarthritis of knee: Secondary | ICD-10-CM | POA: Diagnosis not present

## 2017-09-03 DIAGNOSIS — N3941 Urge incontinence: Secondary | ICD-10-CM | POA: Diagnosis not present

## 2017-09-03 DIAGNOSIS — R2681 Unsteadiness on feet: Secondary | ICD-10-CM | POA: Diagnosis not present

## 2017-09-03 DIAGNOSIS — F419 Anxiety disorder, unspecified: Secondary | ICD-10-CM | POA: Diagnosis not present

## 2017-09-03 DIAGNOSIS — R531 Weakness: Secondary | ICD-10-CM | POA: Diagnosis not present

## 2017-09-03 DIAGNOSIS — M810 Age-related osteoporosis without current pathological fracture: Secondary | ICD-10-CM | POA: Diagnosis not present

## 2017-09-03 DIAGNOSIS — M9903 Segmental and somatic dysfunction of lumbar region: Secondary | ICD-10-CM | POA: Diagnosis not present

## 2017-09-03 DIAGNOSIS — M47899 Other spondylosis, site unspecified: Secondary | ICD-10-CM | POA: Diagnosis not present

## 2017-09-03 DIAGNOSIS — I10 Essential (primary) hypertension: Secondary | ICD-10-CM | POA: Diagnosis not present

## 2017-09-03 DIAGNOSIS — M5416 Radiculopathy, lumbar region: Secondary | ICD-10-CM | POA: Diagnosis not present

## 2017-09-03 DIAGNOSIS — M9904 Segmental and somatic dysfunction of sacral region: Secondary | ICD-10-CM | POA: Diagnosis not present

## 2017-09-03 DIAGNOSIS — G629 Polyneuropathy, unspecified: Secondary | ICD-10-CM | POA: Diagnosis not present

## 2017-09-05 DIAGNOSIS — G629 Polyneuropathy, unspecified: Secondary | ICD-10-CM | POA: Diagnosis not present

## 2017-09-05 DIAGNOSIS — N3941 Urge incontinence: Secondary | ICD-10-CM | POA: Diagnosis not present

## 2017-09-05 DIAGNOSIS — R531 Weakness: Secondary | ICD-10-CM | POA: Diagnosis not present

## 2017-09-05 DIAGNOSIS — M47899 Other spondylosis, site unspecified: Secondary | ICD-10-CM | POA: Diagnosis not present

## 2017-09-05 DIAGNOSIS — I10 Essential (primary) hypertension: Secondary | ICD-10-CM | POA: Diagnosis not present

## 2017-09-05 DIAGNOSIS — M170 Bilateral primary osteoarthritis of knee: Secondary | ICD-10-CM | POA: Diagnosis not present

## 2017-09-05 DIAGNOSIS — R2681 Unsteadiness on feet: Secondary | ICD-10-CM | POA: Diagnosis not present

## 2017-09-05 DIAGNOSIS — F419 Anxiety disorder, unspecified: Secondary | ICD-10-CM | POA: Diagnosis not present

## 2017-09-05 DIAGNOSIS — M810 Age-related osteoporosis without current pathological fracture: Secondary | ICD-10-CM | POA: Diagnosis not present

## 2017-09-06 DIAGNOSIS — M170 Bilateral primary osteoarthritis of knee: Secondary | ICD-10-CM | POA: Diagnosis not present

## 2017-09-06 DIAGNOSIS — F419 Anxiety disorder, unspecified: Secondary | ICD-10-CM | POA: Diagnosis not present

## 2017-09-06 DIAGNOSIS — G629 Polyneuropathy, unspecified: Secondary | ICD-10-CM | POA: Diagnosis not present

## 2017-09-06 DIAGNOSIS — N3941 Urge incontinence: Secondary | ICD-10-CM | POA: Diagnosis not present

## 2017-09-06 DIAGNOSIS — I10 Essential (primary) hypertension: Secondary | ICD-10-CM | POA: Diagnosis not present

## 2017-09-06 DIAGNOSIS — R531 Weakness: Secondary | ICD-10-CM | POA: Diagnosis not present

## 2017-09-06 DIAGNOSIS — R2681 Unsteadiness on feet: Secondary | ICD-10-CM | POA: Diagnosis not present

## 2017-09-06 DIAGNOSIS — M47899 Other spondylosis, site unspecified: Secondary | ICD-10-CM | POA: Diagnosis not present

## 2017-09-06 DIAGNOSIS — M810 Age-related osteoporosis without current pathological fracture: Secondary | ICD-10-CM | POA: Diagnosis not present

## 2017-09-07 DIAGNOSIS — N390 Urinary tract infection, site not specified: Secondary | ICD-10-CM | POA: Diagnosis not present

## 2017-09-08 DIAGNOSIS — G629 Polyneuropathy, unspecified: Secondary | ICD-10-CM | POA: Diagnosis not present

## 2017-09-08 DIAGNOSIS — R2681 Unsteadiness on feet: Secondary | ICD-10-CM | POA: Diagnosis not present

## 2017-09-08 DIAGNOSIS — M810 Age-related osteoporosis without current pathological fracture: Secondary | ICD-10-CM | POA: Diagnosis not present

## 2017-09-08 DIAGNOSIS — R531 Weakness: Secondary | ICD-10-CM | POA: Diagnosis not present

## 2017-09-08 DIAGNOSIS — M47899 Other spondylosis, site unspecified: Secondary | ICD-10-CM | POA: Diagnosis not present

## 2017-09-08 DIAGNOSIS — M170 Bilateral primary osteoarthritis of knee: Secondary | ICD-10-CM | POA: Diagnosis not present

## 2017-09-08 DIAGNOSIS — F419 Anxiety disorder, unspecified: Secondary | ICD-10-CM | POA: Diagnosis not present

## 2017-09-08 DIAGNOSIS — I10 Essential (primary) hypertension: Secondary | ICD-10-CM | POA: Diagnosis not present

## 2017-09-08 DIAGNOSIS — N3941 Urge incontinence: Secondary | ICD-10-CM | POA: Diagnosis not present

## 2017-09-13 DIAGNOSIS — M13862 Other specified arthritis, left knee: Secondary | ICD-10-CM | POA: Diagnosis not present

## 2017-09-13 DIAGNOSIS — N3281 Overactive bladder: Secondary | ICD-10-CM | POA: Diagnosis not present

## 2017-09-13 DIAGNOSIS — M11262 Other chondrocalcinosis, left knee: Secondary | ICD-10-CM | POA: Diagnosis not present

## 2017-09-13 DIAGNOSIS — M5442 Lumbago with sciatica, left side: Secondary | ICD-10-CM | POA: Diagnosis not present

## 2017-09-13 DIAGNOSIS — M5136 Other intervertebral disc degeneration, lumbar region: Secondary | ICD-10-CM | POA: Diagnosis not present

## 2017-09-13 DIAGNOSIS — N3000 Acute cystitis without hematuria: Secondary | ICD-10-CM | POA: Diagnosis not present

## 2017-09-13 DIAGNOSIS — M25562 Pain in left knee: Secondary | ICD-10-CM | POA: Diagnosis not present

## 2017-09-13 DIAGNOSIS — M25552 Pain in left hip: Secondary | ICD-10-CM | POA: Diagnosis not present

## 2017-09-13 DIAGNOSIS — N814 Uterovaginal prolapse, unspecified: Secondary | ICD-10-CM | POA: Diagnosis not present

## 2017-09-20 DIAGNOSIS — M25552 Pain in left hip: Secondary | ICD-10-CM | POA: Diagnosis not present

## 2017-09-24 DIAGNOSIS — M79674 Pain in right toe(s): Secondary | ICD-10-CM | POA: Diagnosis not present

## 2017-09-24 DIAGNOSIS — M2011 Hallux valgus (acquired), right foot: Secondary | ICD-10-CM | POA: Diagnosis not present

## 2017-09-24 DIAGNOSIS — L852 Keratosis punctata (palmaris et plantaris): Secondary | ICD-10-CM | POA: Diagnosis not present

## 2017-09-24 DIAGNOSIS — B351 Tinea unguium: Secondary | ICD-10-CM | POA: Diagnosis not present

## 2017-09-24 DIAGNOSIS — I70203 Unspecified atherosclerosis of native arteries of extremities, bilateral legs: Secondary | ICD-10-CM | POA: Diagnosis not present

## 2017-09-24 DIAGNOSIS — M2012 Hallux valgus (acquired), left foot: Secondary | ICD-10-CM | POA: Diagnosis not present

## 2017-09-24 DIAGNOSIS — M79675 Pain in left toe(s): Secondary | ICD-10-CM | POA: Diagnosis not present

## 2017-09-29 DIAGNOSIS — M5416 Radiculopathy, lumbar region: Secondary | ICD-10-CM | POA: Diagnosis not present

## 2017-09-29 DIAGNOSIS — M545 Low back pain: Secondary | ICD-10-CM | POA: Diagnosis not present

## 2017-09-29 DIAGNOSIS — M9903 Segmental and somatic dysfunction of lumbar region: Secondary | ICD-10-CM | POA: Diagnosis not present

## 2017-09-29 DIAGNOSIS — M9904 Segmental and somatic dysfunction of sacral region: Secondary | ICD-10-CM | POA: Diagnosis not present

## 2017-10-20 DIAGNOSIS — M810 Age-related osteoporosis without current pathological fracture: Secondary | ICD-10-CM | POA: Diagnosis not present

## 2017-10-26 DIAGNOSIS — M5431 Sciatica, right side: Secondary | ICD-10-CM | POA: Diagnosis not present

## 2017-10-27 DIAGNOSIS — M9903 Segmental and somatic dysfunction of lumbar region: Secondary | ICD-10-CM | POA: Diagnosis not present

## 2017-10-27 DIAGNOSIS — M5416 Radiculopathy, lumbar region: Secondary | ICD-10-CM | POA: Diagnosis not present

## 2017-10-27 DIAGNOSIS — M545 Low back pain: Secondary | ICD-10-CM | POA: Diagnosis not present

## 2017-10-27 DIAGNOSIS — M9904 Segmental and somatic dysfunction of sacral region: Secondary | ICD-10-CM | POA: Diagnosis not present

## 2017-10-29 DIAGNOSIS — M5416 Radiculopathy, lumbar region: Secondary | ICD-10-CM | POA: Diagnosis not present

## 2017-10-29 DIAGNOSIS — M545 Low back pain: Secondary | ICD-10-CM | POA: Diagnosis not present

## 2017-10-29 DIAGNOSIS — M9903 Segmental and somatic dysfunction of lumbar region: Secondary | ICD-10-CM | POA: Diagnosis not present

## 2017-10-29 DIAGNOSIS — M9904 Segmental and somatic dysfunction of sacral region: Secondary | ICD-10-CM | POA: Diagnosis not present

## 2017-11-02 DIAGNOSIS — M545 Low back pain: Secondary | ICD-10-CM | POA: Diagnosis not present

## 2017-11-02 DIAGNOSIS — M5416 Radiculopathy, lumbar region: Secondary | ICD-10-CM | POA: Diagnosis not present

## 2017-11-02 DIAGNOSIS — M9903 Segmental and somatic dysfunction of lumbar region: Secondary | ICD-10-CM | POA: Diagnosis not present

## 2017-11-02 DIAGNOSIS — M9904 Segmental and somatic dysfunction of sacral region: Secondary | ICD-10-CM | POA: Diagnosis not present

## 2017-11-03 DIAGNOSIS — G8929 Other chronic pain: Secondary | ICD-10-CM | POA: Diagnosis not present

## 2017-11-03 DIAGNOSIS — M545 Low back pain: Secondary | ICD-10-CM | POA: Diagnosis not present

## 2017-11-03 DIAGNOSIS — M7918 Myalgia, other site: Secondary | ICD-10-CM | POA: Diagnosis not present

## 2017-11-05 DIAGNOSIS — M545 Low back pain: Secondary | ICD-10-CM | POA: Diagnosis not present

## 2017-11-05 DIAGNOSIS — M5416 Radiculopathy, lumbar region: Secondary | ICD-10-CM | POA: Diagnosis not present

## 2017-11-05 DIAGNOSIS — M9904 Segmental and somatic dysfunction of sacral region: Secondary | ICD-10-CM | POA: Diagnosis not present

## 2017-11-05 DIAGNOSIS — M9903 Segmental and somatic dysfunction of lumbar region: Secondary | ICD-10-CM | POA: Diagnosis not present

## 2017-11-10 DIAGNOSIS — M9903 Segmental and somatic dysfunction of lumbar region: Secondary | ICD-10-CM | POA: Diagnosis not present

## 2017-11-10 DIAGNOSIS — M810 Age-related osteoporosis without current pathological fracture: Secondary | ICD-10-CM | POA: Diagnosis not present

## 2017-11-10 DIAGNOSIS — M545 Low back pain: Secondary | ICD-10-CM | POA: Diagnosis not present

## 2017-11-10 DIAGNOSIS — M5416 Radiculopathy, lumbar region: Secondary | ICD-10-CM | POA: Diagnosis not present

## 2017-11-10 DIAGNOSIS — M9904 Segmental and somatic dysfunction of sacral region: Secondary | ICD-10-CM | POA: Diagnosis not present

## 2017-11-19 DIAGNOSIS — M5416 Radiculopathy, lumbar region: Secondary | ICD-10-CM | POA: Diagnosis not present

## 2017-11-19 DIAGNOSIS — M9903 Segmental and somatic dysfunction of lumbar region: Secondary | ICD-10-CM | POA: Diagnosis not present

## 2017-11-19 DIAGNOSIS — M9904 Segmental and somatic dysfunction of sacral region: Secondary | ICD-10-CM | POA: Diagnosis not present

## 2017-11-19 DIAGNOSIS — M545 Low back pain: Secondary | ICD-10-CM | POA: Diagnosis not present

## 2017-11-29 DIAGNOSIS — M5416 Radiculopathy, lumbar region: Secondary | ICD-10-CM | POA: Diagnosis not present

## 2017-11-29 DIAGNOSIS — M9903 Segmental and somatic dysfunction of lumbar region: Secondary | ICD-10-CM | POA: Diagnosis not present

## 2017-11-29 DIAGNOSIS — M9904 Segmental and somatic dysfunction of sacral region: Secondary | ICD-10-CM | POA: Diagnosis not present

## 2017-11-29 DIAGNOSIS — M545 Low back pain: Secondary | ICD-10-CM | POA: Diagnosis not present

## 2017-12-01 DIAGNOSIS — Z23 Encounter for immunization: Secondary | ICD-10-CM | POA: Diagnosis not present

## 2017-12-01 DIAGNOSIS — I10 Essential (primary) hypertension: Secondary | ICD-10-CM | POA: Diagnosis not present

## 2017-12-01 DIAGNOSIS — E7800 Pure hypercholesterolemia, unspecified: Secondary | ICD-10-CM | POA: Diagnosis not present

## 2017-12-03 DIAGNOSIS — M6281 Muscle weakness (generalized): Secondary | ICD-10-CM | POA: Diagnosis not present

## 2017-12-03 DIAGNOSIS — R269 Unspecified abnormalities of gait and mobility: Secondary | ICD-10-CM | POA: Diagnosis not present

## 2017-12-03 DIAGNOSIS — M545 Low back pain: Secondary | ICD-10-CM | POA: Diagnosis not present

## 2017-12-06 DIAGNOSIS — R269 Unspecified abnormalities of gait and mobility: Secondary | ICD-10-CM | POA: Diagnosis not present

## 2017-12-06 DIAGNOSIS — M6281 Muscle weakness (generalized): Secondary | ICD-10-CM | POA: Diagnosis not present

## 2017-12-06 DIAGNOSIS — M545 Low back pain: Secondary | ICD-10-CM | POA: Diagnosis not present

## 2017-12-08 DIAGNOSIS — M6281 Muscle weakness (generalized): Secondary | ICD-10-CM | POA: Diagnosis not present

## 2017-12-08 DIAGNOSIS — M545 Low back pain: Secondary | ICD-10-CM | POA: Diagnosis not present

## 2017-12-08 DIAGNOSIS — R269 Unspecified abnormalities of gait and mobility: Secondary | ICD-10-CM | POA: Diagnosis not present

## 2017-12-10 DIAGNOSIS — M9904 Segmental and somatic dysfunction of sacral region: Secondary | ICD-10-CM | POA: Diagnosis not present

## 2017-12-10 DIAGNOSIS — R269 Unspecified abnormalities of gait and mobility: Secondary | ICD-10-CM | POA: Diagnosis not present

## 2017-12-10 DIAGNOSIS — M6281 Muscle weakness (generalized): Secondary | ICD-10-CM | POA: Diagnosis not present

## 2017-12-10 DIAGNOSIS — M5416 Radiculopathy, lumbar region: Secondary | ICD-10-CM | POA: Diagnosis not present

## 2017-12-10 DIAGNOSIS — M545 Low back pain: Secondary | ICD-10-CM | POA: Diagnosis not present

## 2017-12-10 DIAGNOSIS — M9903 Segmental and somatic dysfunction of lumbar region: Secondary | ICD-10-CM | POA: Diagnosis not present

## 2017-12-13 DIAGNOSIS — M545 Low back pain: Secondary | ICD-10-CM | POA: Diagnosis not present

## 2017-12-13 DIAGNOSIS — R269 Unspecified abnormalities of gait and mobility: Secondary | ICD-10-CM | POA: Diagnosis not present

## 2017-12-13 DIAGNOSIS — M6281 Muscle weakness (generalized): Secondary | ICD-10-CM | POA: Diagnosis not present

## 2017-12-15 DIAGNOSIS — M6281 Muscle weakness (generalized): Secondary | ICD-10-CM | POA: Diagnosis not present

## 2017-12-15 DIAGNOSIS — R269 Unspecified abnormalities of gait and mobility: Secondary | ICD-10-CM | POA: Diagnosis not present

## 2017-12-15 DIAGNOSIS — M545 Low back pain: Secondary | ICD-10-CM | POA: Diagnosis not present

## 2017-12-20 DIAGNOSIS — M545 Low back pain: Secondary | ICD-10-CM | POA: Diagnosis not present

## 2017-12-20 DIAGNOSIS — M6281 Muscle weakness (generalized): Secondary | ICD-10-CM | POA: Diagnosis not present

## 2017-12-20 DIAGNOSIS — R269 Unspecified abnormalities of gait and mobility: Secondary | ICD-10-CM | POA: Diagnosis not present

## 2017-12-22 DIAGNOSIS — M545 Low back pain: Secondary | ICD-10-CM | POA: Diagnosis not present

## 2017-12-22 DIAGNOSIS — M6281 Muscle weakness (generalized): Secondary | ICD-10-CM | POA: Diagnosis not present

## 2017-12-22 DIAGNOSIS — R269 Unspecified abnormalities of gait and mobility: Secondary | ICD-10-CM | POA: Diagnosis not present

## 2017-12-24 DIAGNOSIS — M6281 Muscle weakness (generalized): Secondary | ICD-10-CM | POA: Diagnosis not present

## 2017-12-24 DIAGNOSIS — R269 Unspecified abnormalities of gait and mobility: Secondary | ICD-10-CM | POA: Diagnosis not present

## 2017-12-24 DIAGNOSIS — M545 Low back pain: Secondary | ICD-10-CM | POA: Diagnosis not present

## 2017-12-27 DIAGNOSIS — M545 Low back pain: Secondary | ICD-10-CM | POA: Diagnosis not present

## 2017-12-27 DIAGNOSIS — M9904 Segmental and somatic dysfunction of sacral region: Secondary | ICD-10-CM | POA: Diagnosis not present

## 2017-12-27 DIAGNOSIS — M9903 Segmental and somatic dysfunction of lumbar region: Secondary | ICD-10-CM | POA: Diagnosis not present

## 2017-12-27 DIAGNOSIS — M6281 Muscle weakness (generalized): Secondary | ICD-10-CM | POA: Diagnosis not present

## 2017-12-27 DIAGNOSIS — M5416 Radiculopathy, lumbar region: Secondary | ICD-10-CM | POA: Diagnosis not present

## 2017-12-27 DIAGNOSIS — R269 Unspecified abnormalities of gait and mobility: Secondary | ICD-10-CM | POA: Diagnosis not present

## 2017-12-29 DIAGNOSIS — M545 Low back pain: Secondary | ICD-10-CM | POA: Diagnosis not present

## 2017-12-29 DIAGNOSIS — R269 Unspecified abnormalities of gait and mobility: Secondary | ICD-10-CM | POA: Diagnosis not present

## 2017-12-29 DIAGNOSIS — M6281 Muscle weakness (generalized): Secondary | ICD-10-CM | POA: Diagnosis not present

## 2020-01-08 ENCOUNTER — Emergency Department (HOSPITAL_COMMUNITY): Payer: Medicare PPO

## 2020-01-08 ENCOUNTER — Other Ambulatory Visit (HOSPITAL_COMMUNITY): Payer: Medicare PPO

## 2020-01-08 ENCOUNTER — Other Ambulatory Visit: Payer: Self-pay

## 2020-01-08 ENCOUNTER — Encounter (HOSPITAL_COMMUNITY): Payer: Self-pay

## 2020-01-08 ENCOUNTER — Other Ambulatory Visit (HOSPITAL_COMMUNITY): Payer: Self-pay | Admitting: Radiology

## 2020-01-08 ENCOUNTER — Emergency Department (HOSPITAL_COMMUNITY)
Admission: EM | Admit: 2020-01-08 | Discharge: 2020-01-08 | Disposition: A | Discharge status: Home / Self Care | Payer: Medicare PPO | Attending: Emergency Medicine | Admitting: Emergency Medicine

## 2020-01-08 DIAGNOSIS — Y92090 Kitchen in other non-institutional residence as the place of occurrence of the external cause: Secondary | ICD-10-CM | POA: Insufficient documentation

## 2020-01-08 DIAGNOSIS — R519 Headache, unspecified: Secondary | ICD-10-CM | POA: Insufficient documentation

## 2020-01-08 DIAGNOSIS — Y9301 Activity, walking, marching and hiking: Secondary | ICD-10-CM | POA: Insufficient documentation

## 2020-01-08 DIAGNOSIS — Z5321 Procedure and treatment not carried out due to patient leaving prior to being seen by health care provider: Secondary | ICD-10-CM | POA: Diagnosis not present

## 2020-01-08 DIAGNOSIS — W208XXA Other cause of strike by thrown, projected or falling object, initial encounter: Secondary | ICD-10-CM | POA: Diagnosis not present

## 2020-01-08 DIAGNOSIS — M542 Cervicalgia: Secondary | ICD-10-CM | POA: Insufficient documentation

## 2020-01-08 DIAGNOSIS — M545 Low back pain, unspecified: Secondary | ICD-10-CM | POA: Diagnosis not present

## 2020-01-08 NOTE — ED Triage Notes (Addendum)
Pt reports was walking in socks in her kitchen and fell either Fri or Sat.  Reports hit her head on her counter.  Denies any loss of consciousness.  C/O headache, pain in r side of neck, and lower back pain.

## 2020-01-10 ENCOUNTER — Emergency Department (HOSPITAL_COMMUNITY)
Admission: EM | Admit: 2020-01-10 | Discharge: 2020-01-10 | Disposition: A | Discharge status: Home / Self Care | Payer: Medicare PPO | Attending: Emergency Medicine | Admitting: Emergency Medicine

## 2020-01-10 ENCOUNTER — Other Ambulatory Visit (HOSPITAL_COMMUNITY): Payer: Self-pay | Admitting: Internal Medicine

## 2020-01-10 ENCOUNTER — Emergency Department (HOSPITAL_COMMUNITY): Payer: Medicare PPO

## 2020-01-10 ENCOUNTER — Other Ambulatory Visit: Payer: Self-pay

## 2020-01-10 DIAGNOSIS — M25551 Pain in right hip: Secondary | ICD-10-CM | POA: Insufficient documentation

## 2020-01-10 DIAGNOSIS — W0110XA Fall on same level from slipping, tripping and stumbling with subsequent striking against unspecified object, initial encounter: Secondary | ICD-10-CM | POA: Insufficient documentation

## 2020-01-10 DIAGNOSIS — Y92010 Kitchen of single-family (private) house as the place of occurrence of the external cause: Secondary | ICD-10-CM | POA: Insufficient documentation

## 2020-01-10 DIAGNOSIS — Z79899 Other long term (current) drug therapy: Secondary | ICD-10-CM | POA: Diagnosis not present

## 2020-01-10 DIAGNOSIS — Z7982 Long term (current) use of aspirin: Secondary | ICD-10-CM | POA: Insufficient documentation

## 2020-01-10 DIAGNOSIS — I10 Essential (primary) hypertension: Secondary | ICD-10-CM | POA: Diagnosis not present

## 2020-01-10 DIAGNOSIS — Z7983 Long term (current) use of bisphosphonates: Secondary | ICD-10-CM | POA: Diagnosis not present

## 2020-01-10 DIAGNOSIS — W19XXXA Unspecified fall, initial encounter: Secondary | ICD-10-CM

## 2020-01-10 DIAGNOSIS — S3992XA Unspecified injury of lower back, initial encounter: Secondary | ICD-10-CM

## 2020-01-10 DIAGNOSIS — S3982XA Other specified injuries of lower back, initial encounter: Secondary | ICD-10-CM | POA: Insufficient documentation

## 2020-01-10 MED ORDER — HYDROCODONE-ACETAMINOPHEN 5-325 MG PO TABS
1.0000 | ORAL_TABLET | Freq: Once | ORAL | Status: AC
  Administered 2020-01-10: 1 via ORAL
  Filled 2020-01-10: qty 1

## 2020-01-10 MED ORDER — HYDROCODONE-ACETAMINOPHEN 5-325 MG PO TABS: 2.0000 | ORAL_TABLET | Freq: Three times a day (TID) | ORAL | 0 refills | Status: DC | PRN

## 2020-01-10 NOTE — ED Provider Notes (Signed)
[facility] EMERGENCY DEPARTMENT Provider Note   CSN: [id number] Arrival date & time: 01/10/20  1609     History Chief Complaint  Patient presents with  . Fall    Jessica Bauer is a 86 y.o. female.  HPI 86 year old female presents with continued pain after a fall on 11/8.  She checked in on that date after falling in her kitchen from slippery socks.  She did hit her head and was having low back pain.  Received head CT, cervical spine CT and lumbar x-ray but did not stay for the results.  She is back because it is still painful in her low back.  Pain is mostly with movement/walking.  Has been taking Tylenol every 8 hours with good relief.  No headache. Did not feel dizzy prior to the fall.  Past Medical History:  Diagnosis Date  . Back pain   . Depression   . HTN (hypertension)   . Kidney stones     Patient Active Problem List   Diagnosis Date Noted  . Cellulitis of left lower leg 10/28/2013  . Vaginal yeast infection 10/28/2013  . Hypertension 10/28/2013  . Osteoporosis 10/28/2013  . Loss of weight 09/12/2013  . Esophageal dysphagia 07/13/2013  . Insomnia 07/13/2013  . DIARRHEA 01/01/2010    Past Surgical History:  Procedure Laterality Date  . ABDOMINAL HYSTERECTOMY    . BACK SURGERY  1995  . CHOLECYSTECTOMY  2010  . COLONOSCOPY  12/2009   Dr. Jena Gauss: normal s/p segmental bx (benign)  . ESOPHAGOGASTRODUODENOSCOPY  05/2012   [place]. Schatzki ring, gastritis, hh, pH probe study done, results not available to me  . ESOPHAGOGASTRODUODENOSCOPY  12/2009   small hh, s/p empiric dilation  . KIDNEY STONE SURGERY  2009/2011     OB History   No obstetric history on file.     Family History  Problem Relation Age of Onset  . Stroke Mother   . Stroke Father   . Colon cancer Neg Hx     Social History   Tobacco Use  . Smoking status: Never Smoker  . Smokeless tobacco: Never Used  Substance Use Topics  . Alcohol use: No  . Drug use: No    Home  Medications Prior to Admission medications   Medication Sig Start Date End Date Taking? Authorizing Provider  acetaminophen (TYLENOL) 500 MG tablet Take 500 mg by mouth every 6 (six) hours as needed.   Yes [provider]  Ascorbic Acid (VITAMIN C) 1000 MG tablet Take 1,000 mg by mouth daily.   Yes [provider]  aspirin 81 MG EC tablet Take by mouth.   Yes [provider]  atenolol (TENORMIN) 25 MG tablet Take 25 mg by mouth See admin instructions. Take 25 mg every other day for blood pressure   Yes [provider]  atorvastatin (LIPITOR) 40 MG tablet Take by mouth. 08/12/18  Yes [provider]  cholestyramine Lanetta Inch) 4 G packet Take 1/2 pack to 1 pack once daily for diarrhea. Do not take within 2 hours of other medications. Patient taking differently: Take 2-4 g by mouth daily as needed.  07/13/13  Yes Tiffany Kocher, PA-C  diphenoxylate-atropine (LOMOTIL) 2.5-0.025 MG per tablet Take 1 tablet by mouth 4 (four) times daily as needed for diarrhea or loose stools.   Yes [provider]  DULoxetine (CYMBALTA) 60 MG capsule Take by mouth. 10/12/19  Yes [provider]  hydrocortisone 2.5 % cream Apply topically. 07/04/18  Yes [provider]  ibuprofen (ADVIL) 200 MG tablet Take 200 mg by mouth every 6 (six) hours as needed.   Yes [provider]  loperamide (IMODIUM) 2 MG capsule Take 2 mg by mouth 2 (two) times daily as needed for diarrhea or loose stools.   Yes [provider]  Melatonin 5 MG CAPS Take by mouth.   Yes [provider]  Multiple Vitamins-Minerals (ALIVE ONCE DAILY WOMENS 50+) TABS Take 1 tablet by mouth daily.   Yes [provider]  Omega-3 1000 MG CAPS Take by mouth.   Yes [provider]  pregabalin (LYRICA) 50 MG capsule Take 1 capsule (50 mg total) by mouth 3 (three) times daily. Patient taking differently: Take 50 mg by mouth daily.  01/27/14  Yes Mancel Bale, MD  VITAMIN D, CHOLECALCIFEROL, PO Take 2,000 Units by mouth daily.   Yes [provider]  cephALEXin (KEFLEX) 500 MG capsule Take 1 capsule (500 mg total) by mouth 4 (four) times daily. Patient not taking: Reported on 01/27/2014 10/30/13   Dhungel, Theda Belfast, MD  denosumab (PROLIA) 60 MG/ML SOLN injection Inject 60 mg into the skin every 6 (six) months. Administer in upper arm, thigh, or abdomen Patient not taking: Reported on 01/10/2020    [provider]  glucosamine-chondroitin 500-400 MG tablet Take 1 tablet by mouth 3 (three) times daily.    [provider]  HYDROcodone-acetaminophen (NORCO) 5-325 MG tablet Take 2 tablets by mouth every 8 (eight) hours as needed for severe pain. 01/10/20   Pricilla Loveless, MD  lidocaine (LIDODERM) 5 % Place 1 patch onto the skin daily. Remove & Discard patch within 12 hours or as directed by MD Patient not taking: Reported on 01/10/2020 01/16/14   Zadie Rhine, MD  ramelteon (ROZEREM) 8 MG tablet Take 1 tablet (8 mg total) by mouth at bedtime. Patient not taking: Reported on 01/10/2020 07/13/13   Tiffany Kocher, PA-C  gabapentin (NEURONTIN) 100 MG capsule Take 100 mg by mouth 4 (four) times daily.  01/16/14  [provider]    Allergies    Codeine and Sulfamethoxazole-trimethoprim  Review of Systems   Review of Systems  Musculoskeletal: Positive for back pain.  Neurological: Negative for headaches.  All other systems reviewed and are negative.   Physical Exam Updated Vital Signs BP 132/64   Pulse (!) 59   Temp 98.2 F (36.8 C) (Oral)   Resp 16   Ht 5' (1.524 m)   Wt 54.4 kg   SpO2 95%   BMI 23.44 kg/m   Physical Exam Vitals and nursing note reviewed.  Constitutional:      Appearance: She is well-developed.  HENT:     Head: Normocephalic and atraumatic.     Right Ear: External ear normal.     Left Ear: External ear normal.     Nose: Nose normal.  Eyes:     General:        Right eye:  No discharge.        Left eye: No discharge.  Cardiovascular:     Rate and Rhythm: Normal rate and regular rhythm.     Heart sounds: Normal heart sounds.  Pulmonary:     Effort: Pulmonary effort is normal.     Breath sounds: Normal breath sounds.  Abdominal:     Palpations: Abdomen is soft.     Tenderness: There is no abdominal tenderness.  Musculoskeletal:     Lumbar back: Tenderness present.     Right hip: No  deformity or tenderness. Normal range of motion.     Right upper leg: No deformity or tenderness.     Comments: Pain in inferior lumbar back/sacrum/right posterior pelvis  Skin:    General: Skin is warm and dry.  Neurological:     Mental Status: She is alert.  Psychiatric:        Mood and Affect: Mood is not anxious.     ED Results / Procedures / Treatments   Labs (all labs ordered are listed, but only abnormal results are displayed) Labs Reviewed - No data to display  EKG None  Radiology DG Hip Unilat W or Wo Pelvis 2-3 Views Right  Result Date: 01/10/2020 CLINICAL DATA:  86 year old female with fall. EXAM: DG HIP (WITH OR WITHOUT PELVIS) 2-3V RIGHT COMPARISON:  CT abdomen pelvis dated 12/11/2010. FINDINGS: There is no acute fracture or dislocation. The bones are osteopenic. There is degenerative changes of the lower lumbar spine. The soft tissues are unremarkable. IMPRESSION: No acute fracture or dislocation. Electronically Signed   By: Elgie Collard M.D.   On: 01/10/2020 18:08    Procedures Procedures (including critical care time)  Medications Ordered in ED Medications  HYDROcodone-acetaminophen (NORCO/VICODIN) 5-325 MG per tablet 1 tablet (1 tablet Oral Given 01/10/20 1654)    ED Course  I have reviewed the triage vital signs and the nursing notes.  Pertinent labs & imaging results that were available during my care of the patient were reviewed by me and considered in my medical decision making (see chart for details).    MDM  Rules/Calculators/A&P                          Patient's hip x-ray has been personally reviewed and is benign.  Combined with her negative lumbar x-ray from a couple days ago there is no overt fracture.  She is feeling well with the hydrocodone given.  I discussed options, including seeing how she does over the next couple days versus getting CTs to rule out occult fracture.  Given she is ambulatory I think is reasonable to wait on CTs as she would prefer to just go home with pain control.  Return precautions discussed. Final Clinical Impression(s) / ED Diagnoses Final diagnoses:  Fall, initial encounter  Injury of low back, initial encounter    Rx / DC Orders ED Discharge Orders         Ordered    HYDROcodone-acetaminophen (NORCO) 5-325 MG tablet  Every 8 hours PRN        01/10/20 Merri Brunette, MD 01/10/20 2325

## 2020-01-10 NOTE — Discharge Instructions (Addendum)
If your pain becomes worse or you are unable to walk or your pain does not go away in the next 2-3 days then return to the ER or see your doctor.

## 2020-01-10 NOTE — ED Triage Notes (Signed)
Pt seen here in ED on 11/7, labs and xray done but pt let before getting results.  Pt reports not being able to sit for too long.  Pt here today c/o lower back pain and right hip pain., rating pain 9/10.  Pt is able to walk with walker per support person.

## 2020-01-15 ENCOUNTER — Other Ambulatory Visit: Payer: Self-pay

## 2020-01-15 ENCOUNTER — Encounter (HOSPITAL_COMMUNITY): Payer: Self-pay

## 2020-01-15 ENCOUNTER — Emergency Department (HOSPITAL_COMMUNITY): Payer: Medicare PPO

## 2020-01-15 ENCOUNTER — Emergency Department (HOSPITAL_COMMUNITY)
Admission: EM | Admit: 2020-01-15 | Discharge: 2020-01-15 | Disposition: A | Discharge status: Home / Self Care | Payer: Medicare PPO | Attending: Emergency Medicine | Admitting: Emergency Medicine

## 2020-01-15 DIAGNOSIS — I10 Essential (primary) hypertension: Secondary | ICD-10-CM | POA: Insufficient documentation

## 2020-01-15 DIAGNOSIS — Z79899 Other long term (current) drug therapy: Secondary | ICD-10-CM | POA: Diagnosis not present

## 2020-01-15 DIAGNOSIS — Z7982 Long term (current) use of aspirin: Secondary | ICD-10-CM | POA: Diagnosis not present

## 2020-01-15 DIAGNOSIS — M25551 Pain in right hip: Secondary | ICD-10-CM | POA: Diagnosis present

## 2020-01-15 DIAGNOSIS — W19XXXA Unspecified fall, initial encounter: Secondary | ICD-10-CM | POA: Diagnosis not present

## 2020-01-15 DIAGNOSIS — S32391A Other fracture of right ilium, initial encounter for closed fracture: Secondary | ICD-10-CM | POA: Diagnosis not present

## 2020-01-15 MED ORDER — DICLOFENAC EPOLAMINE 1.3 % EX PTCH
1.0000 | MEDICATED_PATCH | Freq: Two times a day (BID) | CUTANEOUS | Status: DC
  Administered 2020-01-15: 1 via TRANSDERMAL
  Filled 2020-01-15 (×4): qty 1

## 2020-01-15 MED ORDER — DICLOFENAC EPOLAMINE 1.3 % EX PTCH
1.0000 | MEDICATED_PATCH | Freq: Two times a day (BID) | CUTANEOUS | Status: DC
  Filled 2020-01-15 (×4): qty 1

## 2020-01-15 MED ORDER — HYDROCODONE-ACETAMINOPHEN 5-325 MG PO TABS: 1.0000 | ORAL_TABLET | Freq: Three times a day (TID) | ORAL | 0 refills | Status: AC | PRN

## 2020-01-15 NOTE — ED Triage Notes (Signed)
Pt presents to ED with continued right hip pain from fall. Caregiver states she has had xray and CT.

## 2020-01-15 NOTE — Discharge Instructions (Signed)
As discussed, today's evaluation has been generally reassuring.  However, there is evidence on CT scan that you may have sustained a small fracture of your pelvis.  Typically this improves over several weeks, with appropriate pain management.  If you develop new, or concerning changes, return here, otherwise be sure to follow-up with your physician or orthopedic doctor for repeat evaluation.  The CT interpretation of your pelvic abnormality is included below:  Musculoskeletal: There is question of cortical buckling seen at the superior right sacral ala which could represent a nondisplaced fracture. There is diffuse osteopenia. Moderate bilateral hip osteoarthritis with superior joint space loss and marginal osteophyte formation is seen. Degenerative changes seen in the lower lumbar spine. No sacroiliac joint widening however is noted.

## 2020-01-15 NOTE — ED Provider Notes (Signed)
[facility] EMERGENCY DEPARTMENT Provider Note   CSN: [id number] Arrival date & time: 01/15/20  1433     History Chief Complaint  Patient presents with  . Fall    Guido SanderBessie L Harkey is a 86 y.o. female.  HPI    Elderly female arrives with her caregiver who provides much of the history.  On the patient herself is in no distress, but has very poor hearing.  Patient had a fall 1 week ago.  She was seen in the emergency department, left prior to evaluation, return 2 days later with ongoing pain.  Today she returns 7 days after the initial event, now with ongoing pain in her low back, right pelvis.  Pain is sore, moderate, worse with weightbearing, ambulation.  Pain was previously improved with Norco, now minimally improved with Tylenol. Between her initial visit, and that for which she stayed subsequently, she had CT head, neck, x-rays of her pelvis. Additional historical details gained via chart review. Per prior physician note, the patient had discussion with him after she was ambulatory following x-rays, and the decision was made to discharge with ambulatory capacity with consideration of CT later date should her pain persist. She notes in the interval no new pain anywhere, no new head pain, neck pain.  No additional falls. Past Medical History:  Diagnosis Date  . Back pain   . Depression   . HTN (hypertension)   . Kidney stones     Patient Active Problem List   Diagnosis Date Noted  . Cellulitis of left lower leg 10/28/2013  . Vaginal yeast infection 10/28/2013  . Hypertension 10/28/2013  . Osteoporosis 10/28/2013  . Loss of weight 09/12/2013  . Esophageal dysphagia 07/13/2013  . Insomnia 07/13/2013  . DIARRHEA 01/01/2010    Past Surgical History:  Procedure Laterality Date  . ABDOMINAL HYSTERECTOMY    . BACK SURGERY  1995  . CHOLECYSTECTOMY  2010  . COLONOSCOPY  12/2009   Dr. Jena Gaussourk: normal s/p segmental bx (benign)  . ESOPHAGOGASTRODUODENOSCOPY  05/2012   [place].  Schatzki ring, gastritis, hh, pH probe study done, results not available to me  . ESOPHAGOGASTRODUODENOSCOPY  12/2009   small hh, s/p empiric dilation  . KIDNEY STONE SURGERY  2009/2011     OB History   No obstetric history on file.     Family History  Problem Relation Age of Onset  . Stroke Mother   . Stroke Father   . Colon cancer Neg Hx     Social History   Tobacco Use  . Smoking status: Never Smoker  . Smokeless tobacco: Never Used  Substance Use Topics  . Alcohol use: No  . Drug use: No    Home Medications Prior to Admission medications   Medication Sig Start Date End Date Taking? Authorizing Provider  acetaminophen (TYLENOL) 500 MG tablet Take 500 mg by mouth every 6 (six) hours as needed.    [provider]  Ascorbic Acid (VITAMIN C) 1000 MG tablet Take 1,000 mg by mouth daily.    [provider]  aspirin 81 MG EC tablet Take by mouth.    [provider]  atenolol (TENORMIN) 25 MG tablet Take 25 mg by mouth See admin instructions. Take 25 mg every other day for blood pressure    [provider]  atorvastatin (LIPITOR) 40 MG tablet Take by mouth. 08/12/18   [provider]  cephALEXin (KEFLEX) 500 MG capsule Take 1 capsule (500 mg total) by mouth 4 (four) times daily.  Patient not taking: Reported on 01/27/2014 10/30/13   Dhungel, Theda Belfast, MD  cholestyramine Lanetta Inch) 4 G packet Take 1/2 pack to 1 pack once daily for diarrhea. Do not take within 2 hours of other medications. Patient taking differently: Take 2-4 g by mouth daily as needed.  07/13/13   Tiffany Kocher, PA-C  denosumab (PROLIA) 60 MG/ML SOLN injection Inject 60 mg into the skin every 6 (six) months. Administer in upper arm, thigh, or abdomen Patient not taking: Reported on 01/10/2020    [provider]  diphenoxylate-atropine (LOMOTIL) 2.5-0.025 MG per tablet Take 1 tablet by mouth 4 (four) times daily as needed for diarrhea or loose stools.    [provider]  DULoxetine (CYMBALTA) 60 MG capsule Take by mouth. 10/12/19   [provider]  glucosamine-chondroitin 500-400 MG tablet Take 1 tablet by mouth 3 (three) times daily.    [provider]  HYDROcodone-acetaminophen (NORCO) 5-325 MG tablet Take 2 tablets by mouth every 8 (eight) hours as needed for severe pain. 01/10/20   Pricilla Loveless, MD  hydrocortisone 2.5 % cream Apply topically. 07/04/18   [provider]  ibuprofen (ADVIL) 200 MG tablet Take 200 mg by mouth every 6 (six) hours as needed.    [provider]  lidocaine (LIDODERM) 5 % Place 1 patch onto the skin daily. Remove & Discard patch within 12 hours or as directed by MD Patient not taking: Reported on 01/10/2020 01/16/14   Zadie Rhine, MD  loperamide (IMODIUM) 2 MG capsule Take 2 mg by mouth 2 (two) times daily as needed for diarrhea or loose stools.    [provider]  Melatonin 5 MG CAPS Take by mouth.    [provider]  Multiple Vitamins-Minerals (ALIVE ONCE DAILY WOMENS 50+) TABS Take 1 tablet by mouth daily.    [provider]  Omega-3 1000 MG CAPS Take by mouth.    [provider]  pregabalin (LYRICA) 50 MG capsule Take 1 capsule (50 mg total) by mouth 3 (three) times daily. Patient taking differently: Take 50 mg by mouth daily.  01/27/14   Mancel Bale, MD  ramelteon (ROZEREM) 8 MG tablet Take 1 tablet (8 mg total) by mouth at bedtime. Patient not taking: Reported on 01/10/2020 07/13/13   Tiffany Kocher, PA-C  VITAMIN D, CHOLECALCIFEROL, PO Take 2,000 Units by mouth daily.    [provider]  gabapentin (NEURONTIN) 100 MG capsule Take 100 mg by mouth 4 (four) times daily.  01/16/14  [provider]    Allergies    Codeine and Sulfamethoxazole-trimethoprim  Review of Systems   Review of Systems  Constitutional:       Per HPI, otherwise negative  HENT:       Per HPI, otherwise negative  Respiratory:       Per  HPI, otherwise negative  Cardiovascular:       Per HPI, otherwise negative  Gastrointestinal: Negative for vomiting.  Endocrine:       Negative aside from HPI  Genitourinary:       Neg aside from HPI   Musculoskeletal:       Per HPI, otherwise negative  Skin: Negative.   Neurological: Negative for syncope.    Physical Exam Updated Vital Signs BP (!) 164/91 (BP Location: Right Arm)   Pulse 79   Temp 98.2 F (36.8 C) (Oral)   Resp 16   Ht 5' (1.524 m)   Wt 54.4 kg   SpO2 91%  BMI 23.44 kg/m   Physical Exam Vitals and nursing note reviewed.  Constitutional:      General: She is not in acute distress.    Appearance: She is well-developed.  HENT:     Head: Normocephalic and atraumatic.  Eyes:     Conjunctiva/sclera: Conjunctivae normal.  Cardiovascular:     Rate and Rhythm: Normal rate and regular rhythm.  Pulmonary:     Effort: Pulmonary effort is normal. No respiratory distress.     Breath sounds: Normal breath sounds. No stridor.  Abdominal:     General: There is no distension.  Musculoskeletal:     Comments: Patient can flex each hip and both knees independently, spontaneously. There is pain referred to the lower back with hip flexion right greater than left.  Pelvis is stable, tenderness along the right superior posterior hip.  Skin:    General: Skin is warm and dry.  Neurological:     Mental Status: She is alert and oriented to person, place, and time.     Cranial Nerves: No cranial nerve deficit.     ED Results / Procedures / Treatments    Radiology CT PELVIS WO CONTRAST  Result Date: 01/15/2020 CLINICAL DATA:  Pelvic trauma after fall EXAM: CT PELVIS WITHOUT CONTRAST TECHNIQUE: Multidetector CT imaging of the pelvis was performed following the standard protocol without intravenous contrast. COMPARISON:  Radiograph same day FINDINGS: Urinary Tract: The visualized distal ureters and bladder appear unremarkable. Bowel: No bowel wall thickening, distention  or surrounding inflammation identified within the pelvis. A moderate to large amount of colonic stool is present. Vascular/Lymphatic: No enlarged pelvic lymph nodes identified. A tortuous descending aorta is noted with dense vascular calcifications. Reproductive: The patient is status post hysterectomy. No adnexal masses or collections seen. Other: Mild soft tissue swelling over the right hip. Musculoskeletal: There is question of cortical buckling seen at the superior right sacral ala which could represent a nondisplaced fracture. There is diffuse osteopenia. Moderate bilateral hip osteoarthritis with superior joint space loss and marginal osteophyte formation is seen. Degenerative changes seen in the lower lumbar spine. No sacroiliac joint widening however is noted. IMPRESSION: Slight buckling of the superior cortex of the sacral LN which could represent a nondisplaced fracture. Moderate bilateral hip osteoarthritis. Aortic Atherosclerosis (ICD10-I70.0). Electronically Signed   By: Jonna Clark M.D.   On: 01/15/2020 17:20    Procedures Procedures (including critical care time)  Medications Ordered in ED Medications - No data to display  ED Course  I have reviewed the triage vital signs and the nursing notes.  Pertinent labs & imaging results that were available during my care of the patient were reviewed by me and considered in my medical decision making (see chart for details).    5:48 PM On repeat exam the patient is awake, alert, in no distress.  I discussed CT results with her and her caregiver.  We discussed the importance of following up with outpatient providers for repeat evaluation, appropriate analgesia, staying active as much as tolerated.  Discussed analgesia options, the patient will start topical therapy as well as have additional home meds provided.  With no other complaints, CT evidence of nondisplaced fracture, but preserved ambulatory capacity, no indication for admission, patient  discharged in stable condition. MDM Rules/Calculators/A&P MDM Number of Diagnoses or Management Options Other closed fracture of right ilium, initial encounter [facility]): new, needed workup   Amount and/or Complexity of Data Reviewed Tests in the radiology section of CPT: reviewed Decide to obtain  previous medical records or to obtain history from someone other than the patient: yes Obtain history from someone other than the patient: yes Review and summarize past medical records: yes Independent visualization of images, tracings, or specimens: yes  Risk of Complications, Morbidity, and/or Mortality Presenting problems: high Diagnostic procedures: high Management options: high  Critical Care Total time providing critical care: < 30 minutes  Patient Progress Patient progress: stable  Final Clinical Impression(s) / ED Diagnoses Final diagnoses:  Other closed fracture of right ilium, initial encounter [facility])    Rx / DC Orders ED Discharge Orders         Ordered    HYDROcodone-acetaminophen (NORCO) 5-325 MG tablet  Every 8 hours PRN        01/15/20 1747           Gerhard Munch, MD 01/15/20 1751

## 2020-01-26 ENCOUNTER — Other Ambulatory Visit: Payer: Self-pay

## 2020-01-26 ENCOUNTER — Ambulatory Visit
Admission: EM | Admit: 2020-01-26 | Discharge: 2020-01-26 | Disposition: A | Discharge status: Home / Self Care | Payer: Medicare PPO

## 2020-01-26 DIAGNOSIS — M5441 Lumbago with sciatica, right side: Secondary | ICD-10-CM | POA: Diagnosis not present

## 2020-01-26 DIAGNOSIS — R21 Rash and other nonspecific skin eruption: Secondary | ICD-10-CM | POA: Diagnosis not present

## 2020-01-26 MED ORDER — CLOBETASOL PROPIONATE 0.05 % EX GEL: CUTANEOUS | 0 refills | Status: AC

## 2020-01-26 MED ORDER — PREDNISONE 20 MG PO TABS: 20.0000 mg | ORAL_TABLET | Freq: Every day | ORAL | 0 refills | Status: AC

## 2020-01-26 MED ORDER — NABUMETONE 500 MG PO TABS: 500.0000 mg | ORAL_TABLET | Freq: Every day | ORAL | 0 refills | Status: AC

## 2020-01-26 NOTE — ED Triage Notes (Signed)
Pt is here with a rash that started a week ago, pt has not taken any meds to relieve discomfort.

## 2020-01-26 NOTE — Discharge Instructions (Signed)
I have sent in nabumetone for you to take once daily in the morning as anti-inflammatory. If you take just this 1 in the morning, you may still take your Advil PM at night. Just do not take Advil and nabumetone together.  I have also sent in clobetasol to apply to the rash on your arm and shoulder twice a day as needed for itching and inflammation.  I have sent in prednisone for you to take 1 daily in the morning for 5 days to also help reduce the inflammation in your back and down your leg.  Continue to try to get established with primary care  I would try to get him with a spine specialist if symptoms are continuing  If symptoms acutely worsen, follow-up in the ER

## 2020-01-29 NOTE — ED Provider Notes (Signed)
[facility]    CSN: [id number] Arrival date & time: 01/26/20  1652      History   Chief Complaint Chief Complaint  Patient presents with  . Rash    HPI Jessica Bauer is a 86 y.o. female.   Reports that she has a red, raised and itchy rash to her right hand, arm and shoulder. Also reports that she is having R low back and hip pain from a fall about 3 weeks ago. Reports that she has been to the ER for this x 3 in the last 3 weeks. Reports that she was told that she has a fracture in her back, but that there was no surgery needed and nothing that could be done for her right now. [place] not have established PCP here. Denies headache, cough, nausea, vomiting, diarrhea, fever, other symptoms. Is accompanied by personal care aide to this visit.   ROS per HPI  The history is provided by the patient and a caregiver.  Rash   Past Medical History:  Diagnosis Date  . Back pain   . Depression   . HTN (hypertension)   . Kidney stones     Patient Active Problem List   Diagnosis Date Noted  . Cellulitis of left lower leg 10/28/2013  . Vaginal yeast infection 10/28/2013  . Hypertension 10/28/2013  . Osteoporosis 10/28/2013  . Loss of weight 09/12/2013  . Esophageal dysphagia 07/13/2013  . Insomnia 07/13/2013  . DIARRHEA 01/01/2010    Past Surgical History:  Procedure Laterality Date  . ABDOMINAL HYSTERECTOMY    . BACK SURGERY  1995  . CHOLECYSTECTOMY  2010  . COLONOSCOPY  12/2009   Dr. Jena Gauss: normal s/p segmental bx (benign)  . ESOPHAGOGASTRODUODENOSCOPY  05/2012   Michagan. Schatzki ring, gastritis, hh, pH probe study done, results not available to me  . ESOPHAGOGASTRODUODENOSCOPY  12/2009   small hh, s/p empiric dilation  . KIDNEY STONE SURGERY  2009/2011    OB History   No obstetric history on file.      Home Medications    Prior to Admission medications   Medication Sig Start Date End Date Taking? Authorizing Provider   pregabalin (LYRICA) 150 MG capsule Take by mouth. 01/15/20  Yes [provider]  acetaminophen (TYLENOL) 500 MG tablet Take 500 mg by mouth every 6 (six) hours as needed.    [provider]  Ascorbic Acid (VITAMIN C) 1000 MG tablet Take 1,000 mg by mouth daily.    [provider]  aspirin 81 MG EC tablet Take by mouth.    [provider]  atenolol (TENORMIN) 25 MG tablet Take 25 mg by mouth See admin instructions. Take 25 mg every other day for blood pressure    [provider]  atorvastatin (LIPITOR) 40 MG tablet Take by mouth. 08/12/18   [provider]  cephALEXin (KEFLEX) 500 MG capsule Take 1 capsule (500 mg total) by mouth 4 (four) times daily. Patient not taking: Reported on 01/27/2014 10/30/13   Dhungel, Theda Belfast, MD  cholestyramine Lanetta Inch) 4 G packet Take 1/2 pack to 1 pack once daily for diarrhea. Do not take within 2 hours of other medications. Patient taking differently: Take 2-4 g by mouth daily as needed.  07/13/13   Tiffany Kocher, PA-C  clobetasol (TEMOVATE) 0.05 % GEL Apply to affected area twice daily as needed for rash itching and burning 01/26/20   Moshe Cipro, NP  denosumab (PROLIA) 60 MG/ML SOLN  injection Inject 60 mg into the skin every 6 (six) months. Administer in upper arm, thigh, or abdomen Patient not taking: Reported on 01/10/2020    [provider]  diphenoxylate-atropine (LOMOTIL) 2.5-0.025 MG per tablet Take 1 tablet by mouth 4 (four) times daily as needed for diarrhea or loose stools.    [provider]  DULoxetine (CYMBALTA) 60 MG capsule Take by mouth. 10/12/19   [provider]  glucosamine-chondroitin 500-400 MG tablet Take 1 tablet by mouth 3 (three) times daily.    [provider]  hydrocortisone 2.5 % cream Apply topically. 07/04/18   [provider]  ibuprofen (ADVIL) 200 MG tablet Take 200 mg by mouth every 6 (six) hours as needed.    [provider]  lidocaine (LIDODERM) 5 % Place 1 patch onto the skin daily. Remove & Discard patch within 12 hours or as directed by MD Patient not taking: Reported on 01/10/2020 01/16/14   Zadie Rhine, MD  loperamide (IMODIUM) 2 MG capsule Take 2 mg by mouth 2 (two) times daily as needed for diarrhea or loose stools.    [provider]  Melatonin 5 MG CAPS Take by mouth.    [provider]  Multiple Vitamins-Minerals (ALIVE ONCE DAILY WOMENS 50+) TABS Take 1 tablet by mouth daily.    [provider]  nabumetone (RELAFEN) 500 MG tablet Take 1 tablet (500 mg total) by mouth daily. 01/26/20 02/25/20  Moshe Cipro, NP  Omega-3 1000 MG CAPS Take by mouth.    [provider]  predniSONE (DELTASONE) 20 MG tablet Take 1 tablet (20 mg total) by mouth daily with breakfast for 5 days. 01/26/20 01/31/20  Moshe Cipro, NP  pregabalin (LYRICA) 150 MG capsule Take by mouth at bedtime. 01/15/20   [provider]  pregabalin (LYRICA) 50 MG capsule Take 1 capsule (50 mg total) by mouth 3 (three) times daily. Patient taking differently: Take 50 mg by mouth daily.  01/27/14   Mancel Bale, MD  ramelteon (ROZEREM) 8 MG tablet Take 1 tablet (8 mg total) by mouth at bedtime. Patient not taking: Reported on 01/10/2020 07/13/13   Tiffany Kocher, PA-C  VITAMIN D, CHOLECALCIFEROL, PO Take 2,000 Units by mouth daily.    [provider]  gabapentin (NEURONTIN) 100 MG capsule Take 100 mg by mouth 4 (four) times daily.  01/16/14  [provider]    Family History Family History  Problem Relation Age of Onset  . Stroke Mother   . Stroke Father   . Colon cancer Neg Hx     Social History Social History   Tobacco Use  . Smoking status: Never Smoker  . Smokeless tobacco: Never Used  Substance Use Topics  . Alcohol use: No  . Drug use: No     Allergies   Codeine, Sulfamethoxazole, and Sulfamethoxazole-trimethoprim   Review of  Systems Review of Systems  Skin: Positive for rash.     Physical Exam Triage Vital Signs ED Triage Vitals  Enc Vitals Group     BP 01/26/20 1737 108/60     Pulse Rate 01/26/20 1737 60     Resp 01/26/20 1737 15     Temp 01/26/20 1737 98.2 F (36.8 C)     Temp Source 01/26/20 1737 Oral     SpO2 01/26/20 1737 94 %     Weight --      Height --      Head Circumference --      Peak Flow --  Pain Score 01/26/20 1735 0     Pain Loc --      Pain Edu? --      Excl. in GC? --    No data found.  Updated Vital Signs BP 108/60 (BP Location: Right Arm)   Pulse 60   Temp 98.2 F (36.8 C) (Oral)   Resp 15   SpO2 94%   Visual Acuity Right Eye Distance:   Left Eye Distance:   Bilateral Distance:    Right Eye Near:   Left Eye Near:    Bilateral Near:     Physical Exam Vitals and nursing note reviewed.  Constitutional:      General: She is not in acute distress.    Appearance: Normal appearance. She is well-developed. She is not ill-appearing.  HENT:     Head: Normocephalic and atraumatic.     Right Ear: Tympanic membrane, ear canal and external ear normal.     Left Ear: Tympanic membrane, ear canal and external ear normal.     Nose: Nose normal.     Mouth/Throat:     Mouth: Mucous membranes are moist.     Pharynx: Oropharynx is clear.  Eyes:     Extraocular Movements: Extraocular movements intact.     Conjunctiva/sclera: Conjunctivae normal.     Pupils: Pupils are equal, round, and reactive to light.  Cardiovascular:     Rate and Rhythm: Normal rate and regular rhythm.     Heart sounds: Normal heart sounds. No murmur heard.   Pulmonary:     Effort: Pulmonary effort is normal. No respiratory distress.     Breath sounds: Normal breath sounds. No stridor. No wheezing, rhonchi or rales.  Chest:     Chest wall: No tenderness.  Abdominal:     Palpations: Abdomen is soft.     Tenderness: There is no abdominal tenderness.  Musculoskeletal:        General: Tenderness  (R low back, hip) present.     Cervical back: Normal range of motion and neck supple.  Skin:    General: Skin is warm and dry.     Capillary Refill: Capillary refill takes less than 2 seconds.     Findings: Rash present.     Comments: Erythematous, raised rash to dorsal surface of R hand about 3x3 cm in diameter. Also has papular erythematous rashes x 4 going up the R arm and one on the R shoulder with pinpoint central lesion consistent with a bug bite  Neurological:     General: No focal deficit present.     Mental Status: She is alert and oriented to person, place, and time.  Psychiatric:        Mood and Affect: Mood normal.        Behavior: Behavior normal.        Thought Content: Thought content normal.      UC Treatments / Results  Labs (all labs ordered are listed, but only abnormal results are displayed) Labs Reviewed - No data to display  EKG   Radiology No results found.  Procedures Procedures (including critical care time)  Medications Ordered in UC Medications - No data to display  Initial Impression / Assessment and Plan / UC Course  I have reviewed the triage vital signs and the nursing notes.  Pertinent labs & imaging results that were available during my care of the patient were reviewed by me and considered in my medical decision making (see chart for details).  Rash R  low back pain with sciatica  Prescribed Clobetasol May apply BID to affected area If symptoms persist, follow up with this office or with PCP  Prescribed relafen once daily for back and hip pain.  Do not take with ibuprofen. Prescribed steroid for inflammation as well Continue to try to get established with PCP May need pain management referral May also need repeat imaging Follow up with this office as needed Follow up with the ER for severe pain, trouble swallowing, trouble breathing, other concerning symptoms  Final Clinical Impressions(s) / UC Diagnoses   Final diagnoses:    Acute right-sided low back pain with right-sided sciatica  Rash and nonspecific skin eruption     Discharge Instructions     I have sent in nabumetone for you to take once daily in the morning as anti-inflammatory. If you take just this 1 in the morning, you may still take your Advil PM at night. Just do not take Advil and nabumetone together.  I have also sent in clobetasol to apply to the rash on your arm and shoulder twice a day as needed for itching and inflammation.  I have sent in prednisone for you to take 1 daily in the morning for 5 days to also help reduce the inflammation in your back and down your leg.  Continue to try to get established with primary care  I would try to get him with a spine specialist if symptoms are continuing  If symptoms acutely worsen, follow-up in the ER    ED Prescriptions    Medication Sig Dispense Auth. Provider   clobetasol (TEMOVATE) 0.05 % GEL Apply to affected area twice daily as needed for rash itching and burning 30 g Moshe CiproMatthews, Tykiera Raven, NP   predniSONE (DELTASONE) 20 MG tablet Take 1 tablet (20 mg total) by mouth daily with breakfast for 5 days. 5 tablet Moshe CiproMatthews, Delray Reza, NP   nabumetone (RELAFEN) 500 MG tablet Take 1 tablet (500 mg total) by mouth daily. 30 tablet Moshe CiproMatthews, Ellaina Schuler, NP     PDMP not reviewed this encounter.   Moshe CiproMatthews, Bentzion Dauria, NP 01/29/20 1350

## 2020-04-11 ENCOUNTER — Other Ambulatory Visit: Payer: Self-pay | Admitting: Internal Medicine

## 2020-04-11 ENCOUNTER — Other Ambulatory Visit (HOSPITAL_COMMUNITY): Payer: Self-pay | Admitting: Internal Medicine

## 2020-04-11 DIAGNOSIS — I739 Peripheral vascular disease, unspecified: Secondary | ICD-10-CM

## 2020-04-12 ENCOUNTER — Other Ambulatory Visit: Payer: Self-pay

## 2020-04-12 ENCOUNTER — Ambulatory Visit (HOSPITAL_COMMUNITY)
Admission: RE | Admit: 2020-04-12 | Discharge: 2020-04-12 | Disposition: A | Discharge status: Home / Self Care | Payer: Medicare PPO | Source: Ambulatory Visit | Attending: Internal Medicine | Admitting: Internal Medicine

## 2020-04-12 DIAGNOSIS — I739 Peripheral vascular disease, unspecified: Secondary | ICD-10-CM | POA: Insufficient documentation

## 2020-04-29 ENCOUNTER — Encounter: Payer: Medicare PPO | Admitting: Vascular Surgery

## 2020-06-17 ENCOUNTER — Other Ambulatory Visit: Payer: Medicare PPO

## 2020-06-17 ENCOUNTER — Other Ambulatory Visit: Payer: Self-pay

## 2020-06-17 ENCOUNTER — Encounter: Payer: Self-pay | Admitting: Vascular Surgery

## 2020-06-17 ENCOUNTER — Ambulatory Visit: Payer: Medicare PPO | Admitting: Vascular Surgery

## 2020-06-17 VITALS — BP 154/80 | HR 96 | Temp 97.9°F | Resp 14 | Ht 60.0 in | Wt 120.0 lb

## 2020-06-17 DIAGNOSIS — I87303 Chronic venous hypertension (idiopathic) without complications of bilateral lower extremity: Secondary | ICD-10-CM

## 2020-06-17 NOTE — Progress Notes (Signed)
[facility] [place]  Patient name: Jessica Bauer MRN: [id number] DOB: Jul 21, 1925 Sex: female  REASON FOR CONSULT: Evaluation discoloration both lower extremities, rule out arterial insufficiency  HPI: Jessica Bauer is a 87 y.o. female, who is here today for evaluation.  She also has her aide with her.  She helps in some of the history.  The patient is alert and oriented and answers questions appropriately.  She reports that she will occasionally have some bluish discoloration in her left third finger.  Her aide says this is in both hands and occurs in all fingers when her hands are cold and resolves when they are warmed.  Her main issue is regarding her bluish discoloration of both lower extremities.  This is fixed.  It can affect occasionally become more so severe but remains at least bluish-purple coloration continuously.  The patient reports this has been present for years.  She denies any specific pain in her feet.  She does not have any history of tissue loss in her feet.  She did have a tear in the skin on the lateral aspect of her left distal calf and had very prolonged healing of this.  She does have significant lower extremity swelling and this can be worse later in the day according to the patient and her aide  Past Medical History:  Diagnosis Date  . Back pain   . Depression   . HTN (hypertension)   . Kidney stones     Family History  Problem Relation Age of Onset  . Stroke Mother   . Stroke Father   . Colon cancer Neg Hx     SOCIAL HISTORY: Social History   Socioeconomic History  . Marital status: Widowed    Spouse name: Not on file  . Number of children: Not on file  . Years of education: Not on file  . Highest education level: Not on file  Occupational History  . Not on file  Tobacco Use  . Smoking status: Never Smoker  . Smokeless tobacco: Never Used  Substance and Sexual Activity  . Alcohol use:  No  . Drug use: No  . Sexual activity: Not Currently    Birth control/protection: Surgical, None  Other Topics Concern  . Not on file  Social History Narrative  . Not on file   Social Determinants of Health   Financial Resource Strain: Not on file  Food Insecurity: Not on file  Transportation Needs: Not on file  Physical Activity: Not on file  Stress: Not on file  Social Connections: Not on file  Intimate Partner Violence: Not on file    Allergies  Allergen Reactions  . Codeine Other (See Comments)    Unknown  Reaction: didn't tolerate well Reaction: didn't tolerate well   . Sulfamethoxazole Other (See Comments)    Sulfa, Reaction: Hives, blisters Sulfa, Reaction: Hives, blisters   . Sulfamethoxazole-Trimethoprim Other (See Comments) and Hives    Blisters    Current Outpatient Medications  Medication Sig Dispense Refill  . acetaminophen (TYLENOL) 500 MG tablet Take 500 mg by mouth every 6 (six) hours as needed.    . Ascorbic Acid (VITAMIN C) 1000 MG tablet Take 1,000 mg by mouth daily.    Marland Kitchen aspirin 81 MG EC tablet Take by mouth.    Marland Kitchen atenolol (TENORMIN) 25 MG tablet Take 25 mg by mouth See admin instructions. Take 25 mg every other day for blood pressure    . atorvastatin (LIPITOR)  40 MG tablet Take by mouth.    . cephALEXin (KEFLEX) 500 MG capsule Take 1 capsule (500 mg total) by mouth 4 (four) times daily. 32 capsule 0  . cholestyramine (QUESTRAN) 4 G packet Take 1/2 pack to 1 pack once daily for diarrhea. Do not take within 2 hours of other medications. (Patient taking differently: Take 2-4 g by mouth daily as needed.) 30 each 12  . clobetasol (TEMOVATE) 0.05 % GEL Apply to affected area twice daily as needed for rash itching and burning 30 g 0  . diphenoxylate-atropine (LOMOTIL) 2.5-0.025 MG per tablet Take 1 tablet by mouth 4 (four) times daily as needed for diarrhea or loose stools.    . DULoxetine (CYMBALTA) 60 MG capsule Take by mouth.    Marland Kitchen  glucosamine-chondroitin 500-400 MG tablet Take 1 tablet by mouth 3 (three) times daily.    . hydrocortisone 2.5 % cream Apply topically.    Marland Kitchen ibuprofen (ADVIL) 200 MG tablet Take 200 mg by mouth every 6 (six) hours as needed.    . loperamide (IMODIUM) 2 MG capsule Take 2 mg by mouth 2 (two) times daily as needed for diarrhea or loose stools.    . Melatonin 5 MG CAPS Take by mouth.    . Multiple Vitamins-Minerals (ALIVE ONCE DAILY WOMENS 50+) TABS Take 1 tablet by mouth daily.    . Omega-3 1000 MG CAPS Take by mouth.    . pregabalin (LYRICA) 150 MG capsule Take by mouth at bedtime.    . pregabalin (LYRICA) 150 MG capsule Take by mouth.    . pregabalin (LYRICA) 50 MG capsule Take 1 capsule (50 mg total) by mouth 3 (three) times daily. (Patient taking differently: Take 50 mg by mouth daily.) 100 capsule 0  . VITAMIN D, CHOLECALCIFEROL, PO Take 2,000 Units by mouth daily.    Marland Kitchen denosumab (PROLIA) 60 MG/ML SOLN injection Inject 60 mg into the skin every 6 (six) months. Administer in upper arm, thigh, or abdomen (Patient not taking: No sig reported)    . lidocaine (LIDODERM) 5 % Place 1 patch onto the skin daily. Remove & Discard patch within 12 hours or as directed by MD (Patient not taking: No sig reported) 10 patch 0  . ramelteon (ROZEREM) 8 MG tablet Take 1 tablet (8 mg total) by mouth at bedtime. (Patient not taking: No sig reported) 30 tablet 0   No current facility-administered medications for this visit.    REVIEW OF SYSTEMS:  [X]  denotes positive finding, [ ]  denotes negative finding Cardiac  Comments:  Chest pain or chest pressure:    Shortness of breath upon exertion:    Short of breath when lying flat:    Irregular heart rhythm:        Vascular    Pain in calf, thigh, or hip brought on by ambulation:    Pain in feet at night that wakes you up from your sleep:     Blood clot in your veins:    Leg swelling:         Pulmonary    Oxygen at home:    Productive cough:     Wheezing:          Neurologic    Sudden weakness in arms or legs:     Sudden numbness in arms or legs:     Sudden onset of difficulty speaking or slurred speech: x   Temporary loss of vision in one eye:     Problems with dizziness:  Gastrointestinal    Blood in stool:     Vomited blood:         Genitourinary    Burning when urinating:  x   Blood in urine:        Psychiatric    Major depression:         Hematologic    Bleeding problems:    Problems with blood clotting too easily:        Skin    Rashes or ulcers: x       Constitutional    Fever or chills:      PHYSICAL EXAM: Vitals:   06/17/20 1213  BP: (!) 154/80  Pulse: 96  Resp: 14  Temp: 97.9 F (36.6 C)  TempSrc: Temporal  SpO2: (!) 75%  Weight: 120 lb (54.4 kg)  Height: 5' (1.524 m)    GENERAL: The patient is a well-nourished female, in no acute distress. The vital signs are documented above. CARDIOVASCULAR: 2+ radial pulses bilaterally.  2+ posterior tibial pulses bilaterally. PULMONARY: There is good air exchange  MUSCULOSKELETAL: There are no major deformities or cyanosis. NEUROLOGIC: No focal weakness or paresthesias are detected. SKIN: There are no ulcers or rashes noted.  She does have ruborous changes in the skin of her hands and her feet are quite blue-purple bilaterally.  She does have hemosiderin deposits from her mid calves distally to her ankles PSYCHIATRIC: The patient has a normal affect.  DATA:  Noninvasive arterial studies from December 2022 were reviewed.  This does not give an ankle arm index apparently related to calcification.  She does have patent vessels by duplex and multiphasic waveforms at the ankle level.  MEDICAL ISSUES: I discussed these findings with the patient.  She has no evidence of arterial insufficiency in her lower extremities.  I explained that she should have no risk for tissue loss.  She does have significant venous hypertension and venous congestion resulting in bluish  discoloration and swelling of her lower extremities.  Also feel that she is having some element of vasospasm in her hands causing bluish discoloration when they are cold.  She is not having any pain associated with this.  She will continue to treat these issues symptomatically and will see [place] again on an as-needed basis   Larina Earthly, MD [facility] Tel [phone] Pager [phone]  Note: Portions of this report may have been transcribed using voice recognition software.  Every effort has been made to ensure accuracy; however, inadvertent computerized transcription errors may still be present.

## 2020-07-25 DIAGNOSIS — I739 Peripheral vascular disease, unspecified: Secondary | ICD-10-CM | POA: Diagnosis not present

## 2020-07-25 DIAGNOSIS — R634 Abnormal weight loss: Secondary | ICD-10-CM | POA: Diagnosis not present

## 2020-07-25 DIAGNOSIS — F32A Depression, unspecified: Secondary | ICD-10-CM | POA: Diagnosis not present

## 2020-07-25 DIAGNOSIS — Z9181 History of falling: Secondary | ICD-10-CM | POA: Diagnosis not present

## 2020-07-25 DIAGNOSIS — E785 Hyperlipidemia, unspecified: Secondary | ICD-10-CM | POA: Diagnosis not present

## 2020-07-25 DIAGNOSIS — R109 Unspecified abdominal pain: Secondary | ICD-10-CM | POA: Diagnosis not present

## 2020-07-25 DIAGNOSIS — Z681 Body mass index (BMI) 19 or less, adult: Secondary | ICD-10-CM | POA: Diagnosis not present

## 2020-07-25 DIAGNOSIS — I10 Essential (primary) hypertension: Secondary | ICD-10-CM | POA: Diagnosis not present

## 2020-08-03 DIAGNOSIS — I272 Pulmonary hypertension, unspecified: Secondary | ICD-10-CM | POA: Diagnosis not present

## 2020-08-03 DIAGNOSIS — M4856XA Collapsed vertebra, not elsewhere classified, lumbar region, initial encounter for fracture: Secondary | ICD-10-CM | POA: Diagnosis not present

## 2020-08-03 DIAGNOSIS — M6281 Muscle weakness (generalized): Secondary | ICD-10-CM | POA: Diagnosis not present

## 2020-08-03 DIAGNOSIS — R509 Fever, unspecified: Secondary | ICD-10-CM | POA: Diagnosis not present

## 2020-08-03 DIAGNOSIS — N3000 Acute cystitis without hematuria: Secondary | ICD-10-CM | POA: Diagnosis not present

## 2020-08-03 DIAGNOSIS — Z20822 Contact with and (suspected) exposure to covid-19: Secondary | ICD-10-CM | POA: Diagnosis not present

## 2020-08-03 DIAGNOSIS — N3289 Other specified disorders of bladder: Secondary | ICD-10-CM | POA: Diagnosis not present

## 2020-08-03 DIAGNOSIS — S0990XA Unspecified injury of head, initial encounter: Secondary | ICD-10-CM | POA: Diagnosis not present

## 2020-08-03 DIAGNOSIS — E7800 Pure hypercholesterolemia, unspecified: Secondary | ICD-10-CM | POA: Diagnosis not present

## 2020-08-03 DIAGNOSIS — M25551 Pain in right hip: Secondary | ICD-10-CM | POA: Diagnosis not present

## 2020-08-03 DIAGNOSIS — W19XXXA Unspecified fall, initial encounter: Secondary | ICD-10-CM | POA: Diagnosis not present

## 2020-08-03 DIAGNOSIS — M79604 Pain in right leg: Secondary | ICD-10-CM | POA: Diagnosis not present

## 2020-08-03 DIAGNOSIS — M25552 Pain in left hip: Secondary | ICD-10-CM | POA: Diagnosis not present

## 2020-08-03 DIAGNOSIS — I10 Essential (primary) hypertension: Secondary | ICD-10-CM | POA: Diagnosis not present

## 2020-08-03 DIAGNOSIS — R0902 Hypoxemia: Secondary | ICD-10-CM | POA: Diagnosis not present

## 2020-08-03 DIAGNOSIS — R0689 Other abnormalities of breathing: Secondary | ICD-10-CM | POA: Diagnosis not present

## 2020-08-03 DIAGNOSIS — M79651 Pain in right thigh: Secondary | ICD-10-CM | POA: Diagnosis not present

## 2020-08-08 DIAGNOSIS — N200 Calculus of kidney: Secondary | ICD-10-CM | POA: Diagnosis not present

## 2020-08-08 DIAGNOSIS — E7800 Pure hypercholesterolemia, unspecified: Secondary | ICD-10-CM | POA: Diagnosis not present

## 2020-08-08 DIAGNOSIS — I700 Atherosclerosis of aorta: Secondary | ICD-10-CM | POA: Diagnosis not present

## 2020-08-08 DIAGNOSIS — R42 Dizziness and giddiness: Secondary | ICD-10-CM | POA: Diagnosis not present

## 2020-08-08 DIAGNOSIS — M50323 Other cervical disc degeneration at C6-C7 level: Secondary | ICD-10-CM | POA: Diagnosis not present

## 2020-08-08 DIAGNOSIS — R197 Diarrhea, unspecified: Secondary | ICD-10-CM | POA: Diagnosis not present

## 2020-08-08 DIAGNOSIS — R0600 Dyspnea, unspecified: Secondary | ICD-10-CM | POA: Diagnosis not present

## 2020-08-08 DIAGNOSIS — R059 Cough, unspecified: Secondary | ICD-10-CM | POA: Diagnosis not present

## 2020-08-08 DIAGNOSIS — R634 Abnormal weight loss: Secondary | ICD-10-CM | POA: Diagnosis not present

## 2020-08-08 DIAGNOSIS — R0689 Other abnormalities of breathing: Secondary | ICD-10-CM | POA: Diagnosis not present

## 2020-08-08 DIAGNOSIS — R531 Weakness: Secondary | ICD-10-CM | POA: Diagnosis not present

## 2020-08-08 DIAGNOSIS — S0990XA Unspecified injury of head, initial encounter: Secondary | ICD-10-CM | POA: Diagnosis not present

## 2020-08-08 DIAGNOSIS — W19XXXA Unspecified fall, initial encounter: Secondary | ICD-10-CM | POA: Diagnosis not present

## 2020-08-08 DIAGNOSIS — I714 Abdominal aortic aneurysm, without rupture: Secondary | ICD-10-CM | POA: Diagnosis not present

## 2020-08-08 DIAGNOSIS — R0902 Hypoxemia: Secondary | ICD-10-CM | POA: Diagnosis not present

## 2020-08-08 DIAGNOSIS — G319 Degenerative disease of nervous system, unspecified: Secondary | ICD-10-CM | POA: Diagnosis not present

## 2020-08-08 DIAGNOSIS — K449 Diaphragmatic hernia without obstruction or gangrene: Secondary | ICD-10-CM | POA: Diagnosis not present

## 2020-08-08 DIAGNOSIS — G3189 Other specified degenerative diseases of nervous system: Secondary | ICD-10-CM | POA: Diagnosis not present

## 2020-08-08 DIAGNOSIS — I7789 Other specified disorders of arteries and arterioles: Secondary | ICD-10-CM | POA: Diagnosis not present

## 2020-08-08 DIAGNOSIS — N3000 Acute cystitis without hematuria: Secondary | ICD-10-CM | POA: Diagnosis not present

## 2020-08-08 DIAGNOSIS — I716 Thoracoabdominal aortic aneurysm, without rupture: Secondary | ICD-10-CM | POA: Diagnosis not present

## 2020-08-08 DIAGNOSIS — I10 Essential (primary) hypertension: Secondary | ICD-10-CM | POA: Diagnosis not present

## 2020-08-13 ENCOUNTER — Other Ambulatory Visit: Payer: Self-pay

## 2020-08-13 DIAGNOSIS — Z111 Encounter for screening for respiratory tuberculosis: Secondary | ICD-10-CM | POA: Diagnosis not present

## 2020-08-13 NOTE — Patient Outreach (Signed)
[facility]) Care Management  08/13/2020  ALEXZIA KASLER [birth year] [id number]   Telephone Screen  Referral Date: 08/12/2020 Referral Source: MD Office Referral Reason: "placement for LTC-contact Tammy [phone]"     Outreach attempt #1. No answer at present. RN CM left HIPAA compliant voicemail message along with contact info.       Plan: RN CM will make outreach attempt within 3-4 business days.    Antionette Fairy, RN,BSN,CCM [facility] Care Management Telephonic Care Management Coordinator Direct Phone: [phone] Toll Free: [phone] Fax: [phone]

## 2020-08-15 ENCOUNTER — Other Ambulatory Visit: Payer: Self-pay

## 2020-08-15 DIAGNOSIS — E785 Hyperlipidemia, unspecified: Secondary | ICD-10-CM | POA: Diagnosis not present

## 2020-08-15 DIAGNOSIS — R634 Abnormal weight loss: Secondary | ICD-10-CM | POA: Diagnosis not present

## 2020-08-15 DIAGNOSIS — I712 Thoracic aortic aneurysm, without rupture: Secondary | ICD-10-CM | POA: Diagnosis not present

## 2020-08-15 DIAGNOSIS — F32A Depression, unspecified: Secondary | ICD-10-CM | POA: Diagnosis not present

## 2020-08-15 DIAGNOSIS — I10 Essential (primary) hypertension: Secondary | ICD-10-CM | POA: Diagnosis not present

## 2020-08-15 DIAGNOSIS — Z9181 History of falling: Secondary | ICD-10-CM | POA: Diagnosis not present

## 2020-08-15 DIAGNOSIS — I714 Abdominal aortic aneurysm, without rupture: Secondary | ICD-10-CM | POA: Diagnosis not present

## 2020-08-15 DIAGNOSIS — Z6821 Body mass index (BMI) 21.0-21.9, adult: Secondary | ICD-10-CM | POA: Diagnosis not present

## 2020-08-15 NOTE — Patient Outreach (Signed)
[facility]) Care Management  08/15/2020  CARREN BLAKLEY 1925/10/23 [id number]   Telephone Screen   Referral Date: 08/12/2020 Referral Source: MD Office Referral Reason: "placement for LTC-contact Tammy [phone]"   Outreach attempt # 2 to patient. No answer at present.      Plan: RN CM will make outreach attempt to patient within 3-4 business days.    Antionette Fairy, RN,BSN,CCM [facility] Care Management Telephonic Care Management Coordinator Direct Phone: [phone] Toll Free: [phone] Fax: [phone]

## 2020-08-20 ENCOUNTER — Other Ambulatory Visit: Payer: Self-pay

## 2020-08-20 NOTE — Patient Outreach (Signed)
[facility]) Care Management  08/20/2020  Jessica Bauer [birth year] [id number]    Telephone Screen   Referral Date: 08/12/2020 Referral Source: MD Office Referral Reason: "placement for LTC-contact Tammy [phone]"    Outreach attempt # 3. Spoke with Tammy. She reports that they have already gotten patient placed into [facility] facility.      Plan: RN CM will close referral at this time.   Antionette Fairy, RN,BSN,CCM [facility] Care Management Telephonic Care Management Coordinator Direct Phone: [phone] Toll Free: [phone] Fax: [phone]

## 2020-10-24 DIAGNOSIS — I729 Aneurysm of unspecified site: Secondary | ICD-10-CM | POA: Diagnosis not present

## 2020-10-24 DIAGNOSIS — B0229 Other postherpetic nervous system involvement: Secondary | ICD-10-CM | POA: Diagnosis not present

## 2020-10-24 DIAGNOSIS — Z681 Body mass index (BMI) 19 or less, adult: Secondary | ICD-10-CM | POA: Diagnosis not present

## 2020-12-30 DIAGNOSIS — Z23 Encounter for immunization: Secondary | ICD-10-CM | POA: Diagnosis not present

## 2020-12-30 DIAGNOSIS — I719 Aortic aneurysm of unspecified site, without rupture: Secondary | ICD-10-CM | POA: Diagnosis not present

## 2020-12-30 DIAGNOSIS — E785 Hyperlipidemia, unspecified: Secondary | ICD-10-CM | POA: Diagnosis not present

## 2020-12-30 DIAGNOSIS — I10 Essential (primary) hypertension: Secondary | ICD-10-CM | POA: Diagnosis not present

## 2020-12-30 DIAGNOSIS — K5900 Constipation, unspecified: Secondary | ICD-10-CM | POA: Diagnosis not present

## 2020-12-30 DIAGNOSIS — R634 Abnormal weight loss: Secondary | ICD-10-CM | POA: Diagnosis not present

## 2020-12-30 DIAGNOSIS — F32A Depression, unspecified: Secondary | ICD-10-CM | POA: Diagnosis not present

## 2021-02-07 DIAGNOSIS — Z7182 Exercise counseling: Secondary | ICD-10-CM | POA: Diagnosis not present

## 2021-02-07 DIAGNOSIS — Z0000 Encounter for general adult medical examination without abnormal findings: Secondary | ICD-10-CM | POA: Diagnosis not present

## 2021-02-07 DIAGNOSIS — R296 Repeated falls: Secondary | ICD-10-CM | POA: Diagnosis not present

## 2021-02-07 DIAGNOSIS — E7800 Pure hypercholesterolemia, unspecified: Secondary | ICD-10-CM | POA: Diagnosis not present

## 2021-02-07 DIAGNOSIS — E785 Hyperlipidemia, unspecified: Secondary | ICD-10-CM | POA: Diagnosis not present

## 2021-02-07 DIAGNOSIS — R2689 Other abnormalities of gait and mobility: Secondary | ICD-10-CM | POA: Diagnosis not present

## 2021-02-07 DIAGNOSIS — R531 Weakness: Secondary | ICD-10-CM | POA: Diagnosis not present

## 2021-02-07 DIAGNOSIS — Z713 Dietary counseling and surveillance: Secondary | ICD-10-CM | POA: Diagnosis not present

## 2021-02-07 DIAGNOSIS — I10 Essential (primary) hypertension: Secondary | ICD-10-CM | POA: Diagnosis not present

## 2021-02-10 DIAGNOSIS — M6281 Muscle weakness (generalized): Secondary | ICD-10-CM | POA: Diagnosis not present

## 2021-02-10 DIAGNOSIS — R2689 Other abnormalities of gait and mobility: Secondary | ICD-10-CM | POA: Diagnosis not present

## 2021-02-14 DIAGNOSIS — M6281 Muscle weakness (generalized): Secondary | ICD-10-CM | POA: Diagnosis not present

## 2021-02-14 DIAGNOSIS — R2689 Other abnormalities of gait and mobility: Secondary | ICD-10-CM | POA: Diagnosis not present

## 2021-02-25 DIAGNOSIS — M6281 Muscle weakness (generalized): Secondary | ICD-10-CM | POA: Diagnosis not present

## 2021-02-25 DIAGNOSIS — R2689 Other abnormalities of gait and mobility: Secondary | ICD-10-CM | POA: Diagnosis not present

## 2021-02-27 DIAGNOSIS — M6281 Muscle weakness (generalized): Secondary | ICD-10-CM | POA: Diagnosis not present

## 2021-02-27 DIAGNOSIS — R2689 Other abnormalities of gait and mobility: Secondary | ICD-10-CM | POA: Diagnosis not present

## 2021-03-05 DIAGNOSIS — R2689 Other abnormalities of gait and mobility: Secondary | ICD-10-CM | POA: Diagnosis not present

## 2021-03-05 DIAGNOSIS — M6281 Muscle weakness (generalized): Secondary | ICD-10-CM | POA: Diagnosis not present

## 2021-03-07 DIAGNOSIS — R2689 Other abnormalities of gait and mobility: Secondary | ICD-10-CM | POA: Diagnosis not present

## 2021-03-07 DIAGNOSIS — M6281 Muscle weakness (generalized): Secondary | ICD-10-CM | POA: Diagnosis not present

## 2021-03-11 DIAGNOSIS — M6281 Muscle weakness (generalized): Secondary | ICD-10-CM | POA: Diagnosis not present

## 2021-03-11 DIAGNOSIS — R2689 Other abnormalities of gait and mobility: Secondary | ICD-10-CM | POA: Diagnosis not present

## 2021-03-18 DIAGNOSIS — R2689 Other abnormalities of gait and mobility: Secondary | ICD-10-CM | POA: Diagnosis not present

## 2021-03-18 DIAGNOSIS — M6281 Muscle weakness (generalized): Secondary | ICD-10-CM | POA: Diagnosis not present

## 2021-03-24 DIAGNOSIS — I10 Essential (primary) hypertension: Secondary | ICD-10-CM | POA: Diagnosis not present

## 2021-03-24 DIAGNOSIS — E7800 Pure hypercholesterolemia, unspecified: Secondary | ICD-10-CM | POA: Diagnosis not present

## 2021-03-24 DIAGNOSIS — E785 Hyperlipidemia, unspecified: Secondary | ICD-10-CM | POA: Diagnosis not present

## 2021-03-25 DIAGNOSIS — R2689 Other abnormalities of gait and mobility: Secondary | ICD-10-CM | POA: Diagnosis not present

## 2021-03-25 DIAGNOSIS — M6281 Muscle weakness (generalized): Secondary | ICD-10-CM | POA: Diagnosis not present

## 2021-03-27 DIAGNOSIS — R2689 Other abnormalities of gait and mobility: Secondary | ICD-10-CM | POA: Diagnosis not present

## 2021-03-27 DIAGNOSIS — M6281 Muscle weakness (generalized): Secondary | ICD-10-CM | POA: Diagnosis not present

## 2021-03-27 DIAGNOSIS — K591 Functional diarrhea: Secondary | ICD-10-CM | POA: Diagnosis not present

## 2021-03-27 DIAGNOSIS — I872 Venous insufficiency (chronic) (peripheral): Secondary | ICD-10-CM | POA: Diagnosis not present

## 2021-03-27 DIAGNOSIS — I10 Essential (primary) hypertension: Secondary | ICD-10-CM | POA: Diagnosis not present

## 2021-03-31 DIAGNOSIS — M6281 Muscle weakness (generalized): Secondary | ICD-10-CM | POA: Diagnosis not present

## 2021-03-31 DIAGNOSIS — R2689 Other abnormalities of gait and mobility: Secondary | ICD-10-CM | POA: Diagnosis not present

## 2021-04-08 DIAGNOSIS — G8929 Other chronic pain: Secondary | ICD-10-CM | POA: Diagnosis not present

## 2021-04-08 DIAGNOSIS — M6281 Muscle weakness (generalized): Secondary | ICD-10-CM | POA: Diagnosis not present

## 2021-04-08 DIAGNOSIS — R2689 Other abnormalities of gait and mobility: Secondary | ICD-10-CM | POA: Diagnosis not present

## 2021-04-08 DIAGNOSIS — M47896 Other spondylosis, lumbar region: Secondary | ICD-10-CM | POA: Diagnosis not present

## 2021-04-08 DIAGNOSIS — M545 Low back pain, unspecified: Secondary | ICD-10-CM | POA: Diagnosis not present

## 2021-04-10 DIAGNOSIS — R2689 Other abnormalities of gait and mobility: Secondary | ICD-10-CM | POA: Diagnosis not present

## 2021-04-10 DIAGNOSIS — M6281 Muscle weakness (generalized): Secondary | ICD-10-CM | POA: Diagnosis not present

## 2021-04-15 DIAGNOSIS — M6281 Muscle weakness (generalized): Secondary | ICD-10-CM | POA: Diagnosis not present

## 2021-04-15 DIAGNOSIS — R2689 Other abnormalities of gait and mobility: Secondary | ICD-10-CM | POA: Diagnosis not present

## 2021-04-16 DIAGNOSIS — K449 Diaphragmatic hernia without obstruction or gangrene: Secondary | ICD-10-CM | POA: Diagnosis not present

## 2021-04-16 DIAGNOSIS — R079 Chest pain, unspecified: Secondary | ICD-10-CM | POA: Diagnosis not present

## 2021-04-16 DIAGNOSIS — T18128A Food in esophagus causing other injury, initial encounter: Secondary | ICD-10-CM | POA: Diagnosis not present

## 2021-04-16 DIAGNOSIS — K220 Achalasia of cardia: Secondary | ICD-10-CM | POA: Diagnosis not present

## 2021-04-16 DIAGNOSIS — K2950 Unspecified chronic gastritis without bleeding: Secondary | ICD-10-CM | POA: Diagnosis not present

## 2021-04-16 DIAGNOSIS — Z20822 Contact with and (suspected) exposure to covid-19: Secondary | ICD-10-CM | POA: Diagnosis not present

## 2021-04-16 DIAGNOSIS — R1310 Dysphagia, unspecified: Secondary | ICD-10-CM | POA: Diagnosis not present

## 2021-04-16 DIAGNOSIS — R0789 Other chest pain: Secondary | ICD-10-CM | POA: Diagnosis not present

## 2021-04-16 DIAGNOSIS — R1013 Epigastric pain: Secondary | ICD-10-CM | POA: Diagnosis not present

## 2021-04-16 DIAGNOSIS — B3781 Candidal esophagitis: Secondary | ICD-10-CM | POA: Diagnosis not present

## 2021-04-17 DIAGNOSIS — R9431 Abnormal electrocardiogram [ECG] [EKG]: Secondary | ICD-10-CM | POA: Diagnosis not present

## 2021-04-17 DIAGNOSIS — K449 Diaphragmatic hernia without obstruction or gangrene: Secondary | ICD-10-CM | POA: Diagnosis not present

## 2021-04-17 DIAGNOSIS — R1310 Dysphagia, unspecified: Secondary | ICD-10-CM | POA: Diagnosis not present

## 2021-04-18 DIAGNOSIS — R933 Abnormal findings on diagnostic imaging of other parts of digestive tract: Secondary | ICD-10-CM | POA: Diagnosis not present

## 2021-04-18 DIAGNOSIS — K449 Diaphragmatic hernia without obstruction or gangrene: Secondary | ICD-10-CM | POA: Diagnosis not present

## 2021-04-18 DIAGNOSIS — R1310 Dysphagia, unspecified: Secondary | ICD-10-CM | POA: Diagnosis not present

## 2021-04-18 DIAGNOSIS — B3781 Candidal esophagitis: Secondary | ICD-10-CM | POA: Diagnosis not present

## 2021-04-18 DIAGNOSIS — K2970 Gastritis, unspecified, without bleeding: Secondary | ICD-10-CM | POA: Diagnosis not present

## 2021-04-19 DIAGNOSIS — K220 Achalasia of cardia: Secondary | ICD-10-CM | POA: Diagnosis not present

## 2021-04-19 DIAGNOSIS — K449 Diaphragmatic hernia without obstruction or gangrene: Secondary | ICD-10-CM | POA: Diagnosis not present

## 2021-04-19 DIAGNOSIS — R1310 Dysphagia, unspecified: Secondary | ICD-10-CM | POA: Diagnosis not present

## 2021-04-20 DIAGNOSIS — K220 Achalasia of cardia: Secondary | ICD-10-CM | POA: Diagnosis not present

## 2021-04-20 DIAGNOSIS — R1310 Dysphagia, unspecified: Secondary | ICD-10-CM | POA: Diagnosis not present

## 2021-04-20 DIAGNOSIS — K449 Diaphragmatic hernia without obstruction or gangrene: Secondary | ICD-10-CM | POA: Diagnosis not present

## 2021-04-21 DIAGNOSIS — K449 Diaphragmatic hernia without obstruction or gangrene: Secondary | ICD-10-CM | POA: Diagnosis not present

## 2021-04-21 DIAGNOSIS — K220 Achalasia of cardia: Secondary | ICD-10-CM | POA: Diagnosis not present

## 2021-04-21 DIAGNOSIS — R1310 Dysphagia, unspecified: Secondary | ICD-10-CM | POA: Diagnosis not present

## 2021-04-22 DIAGNOSIS — G629 Polyneuropathy, unspecified: Secondary | ICD-10-CM | POA: Diagnosis not present

## 2021-04-22 DIAGNOSIS — M6281 Muscle weakness (generalized): Secondary | ICD-10-CM | POA: Diagnosis not present

## 2021-04-22 DIAGNOSIS — Z741 Need for assistance with personal care: Secondary | ICD-10-CM | POA: Diagnosis not present

## 2021-04-22 DIAGNOSIS — K2920 Alcoholic gastritis without bleeding: Secondary | ICD-10-CM | POA: Diagnosis not present

## 2021-04-22 DIAGNOSIS — K449 Diaphragmatic hernia without obstruction or gangrene: Secondary | ICD-10-CM | POA: Diagnosis not present

## 2021-04-22 DIAGNOSIS — K591 Functional diarrhea: Secondary | ICD-10-CM | POA: Diagnosis not present

## 2021-04-22 DIAGNOSIS — T18128A Food in esophagus causing other injury, initial encounter: Secondary | ICD-10-CM | POA: Diagnosis not present

## 2021-04-22 DIAGNOSIS — Z20822 Contact with and (suspected) exposure to covid-19: Secondary | ICD-10-CM | POA: Diagnosis not present

## 2021-04-22 DIAGNOSIS — K2950 Unspecified chronic gastritis without bleeding: Secondary | ICD-10-CM | POA: Diagnosis not present

## 2021-04-22 DIAGNOSIS — B3781 Candidal esophagitis: Secondary | ICD-10-CM | POA: Diagnosis not present

## 2021-04-22 DIAGNOSIS — M545 Low back pain, unspecified: Secondary | ICD-10-CM | POA: Diagnosis not present

## 2021-04-22 DIAGNOSIS — F32A Depression, unspecified: Secondary | ICD-10-CM | POA: Diagnosis not present

## 2021-04-22 DIAGNOSIS — K2970 Gastritis, unspecified, without bleeding: Secondary | ICD-10-CM | POA: Diagnosis not present

## 2021-04-22 DIAGNOSIS — R531 Weakness: Secondary | ICD-10-CM | POA: Diagnosis not present

## 2021-04-22 DIAGNOSIS — R2689 Other abnormalities of gait and mobility: Secondary | ICD-10-CM | POA: Diagnosis not present

## 2021-04-22 DIAGNOSIS — K220 Achalasia of cardia: Secondary | ICD-10-CM | POA: Diagnosis not present

## 2021-04-22 DIAGNOSIS — I739 Peripheral vascular disease, unspecified: Secondary | ICD-10-CM | POA: Diagnosis not present

## 2021-04-22 DIAGNOSIS — R1310 Dysphagia, unspecified: Secondary | ICD-10-CM | POA: Diagnosis not present

## 2021-04-22 DIAGNOSIS — R079 Chest pain, unspecified: Secondary | ICD-10-CM | POA: Diagnosis not present

## 2021-04-22 DIAGNOSIS — N390 Urinary tract infection, site not specified: Secondary | ICD-10-CM | POA: Diagnosis not present

## 2021-04-22 DIAGNOSIS — I10 Essential (primary) hypertension: Secondary | ICD-10-CM | POA: Diagnosis not present

## 2021-04-22 DIAGNOSIS — I872 Venous insufficiency (chronic) (peripheral): Secondary | ICD-10-CM | POA: Diagnosis not present

## 2021-04-25 DIAGNOSIS — R531 Weakness: Secondary | ICD-10-CM | POA: Diagnosis not present

## 2021-04-25 DIAGNOSIS — I10 Essential (primary) hypertension: Secondary | ICD-10-CM | POA: Diagnosis not present

## 2021-04-25 DIAGNOSIS — K449 Diaphragmatic hernia without obstruction or gangrene: Secondary | ICD-10-CM | POA: Diagnosis not present

## 2021-04-25 DIAGNOSIS — R1310 Dysphagia, unspecified: Secondary | ICD-10-CM | POA: Diagnosis not present

## 2021-04-25 DIAGNOSIS — K220 Achalasia of cardia: Secondary | ICD-10-CM | POA: Diagnosis not present

## 2021-04-25 DIAGNOSIS — F32A Depression, unspecified: Secondary | ICD-10-CM | POA: Diagnosis not present

## 2021-04-25 DIAGNOSIS — G629 Polyneuropathy, unspecified: Secondary | ICD-10-CM | POA: Diagnosis not present

## 2021-04-28 DIAGNOSIS — I10 Essential (primary) hypertension: Secondary | ICD-10-CM | POA: Diagnosis not present

## 2021-04-28 DIAGNOSIS — K220 Achalasia of cardia: Secondary | ICD-10-CM | POA: Diagnosis not present

## 2021-04-28 DIAGNOSIS — M6281 Muscle weakness (generalized): Secondary | ICD-10-CM | POA: Diagnosis not present

## 2021-04-28 DIAGNOSIS — R1310 Dysphagia, unspecified: Secondary | ICD-10-CM | POA: Diagnosis not present

## 2021-04-28 DIAGNOSIS — Z741 Need for assistance with personal care: Secondary | ICD-10-CM | POA: Diagnosis not present

## 2021-04-28 DIAGNOSIS — R531 Weakness: Secondary | ICD-10-CM | POA: Diagnosis not present

## 2021-04-28 DIAGNOSIS — B3781 Candidal esophagitis: Secondary | ICD-10-CM | POA: Diagnosis not present

## 2021-04-28 DIAGNOSIS — R2689 Other abnormalities of gait and mobility: Secondary | ICD-10-CM | POA: Diagnosis not present

## 2021-04-30 DIAGNOSIS — R2689 Other abnormalities of gait and mobility: Secondary | ICD-10-CM | POA: Diagnosis not present

## 2021-04-30 DIAGNOSIS — Z741 Need for assistance with personal care: Secondary | ICD-10-CM | POA: Diagnosis not present

## 2021-04-30 DIAGNOSIS — M6281 Muscle weakness (generalized): Secondary | ICD-10-CM | POA: Diagnosis not present

## 2021-05-05 DIAGNOSIS — Z741 Need for assistance with personal care: Secondary | ICD-10-CM | POA: Diagnosis not present

## 2021-05-05 DIAGNOSIS — M6281 Muscle weakness (generalized): Secondary | ICD-10-CM | POA: Diagnosis not present

## 2021-05-05 DIAGNOSIS — R2689 Other abnormalities of gait and mobility: Secondary | ICD-10-CM | POA: Diagnosis not present

## 2021-05-06 DIAGNOSIS — I10 Essential (primary) hypertension: Secondary | ICD-10-CM | POA: Diagnosis not present

## 2021-05-06 DIAGNOSIS — R531 Weakness: Secondary | ICD-10-CM | POA: Diagnosis not present

## 2021-05-06 DIAGNOSIS — F32A Depression, unspecified: Secondary | ICD-10-CM | POA: Diagnosis not present

## 2021-05-06 DIAGNOSIS — G629 Polyneuropathy, unspecified: Secondary | ICD-10-CM | POA: Diagnosis not present

## 2021-05-06 DIAGNOSIS — B3781 Candidal esophagitis: Secondary | ICD-10-CM | POA: Diagnosis not present

## 2021-05-06 DIAGNOSIS — R1310 Dysphagia, unspecified: Secondary | ICD-10-CM | POA: Diagnosis not present

## 2021-05-06 DIAGNOSIS — K220 Achalasia of cardia: Secondary | ICD-10-CM | POA: Diagnosis not present

## 2021-05-07 DIAGNOSIS — Z741 Need for assistance with personal care: Secondary | ICD-10-CM | POA: Diagnosis not present

## 2021-05-07 DIAGNOSIS — M6281 Muscle weakness (generalized): Secondary | ICD-10-CM | POA: Diagnosis not present

## 2021-05-07 DIAGNOSIS — R2689 Other abnormalities of gait and mobility: Secondary | ICD-10-CM | POA: Diagnosis not present

## 2021-05-12 DIAGNOSIS — M6281 Muscle weakness (generalized): Secondary | ICD-10-CM | POA: Diagnosis not present

## 2021-05-12 DIAGNOSIS — R2689 Other abnormalities of gait and mobility: Secondary | ICD-10-CM | POA: Diagnosis not present

## 2021-05-12 DIAGNOSIS — Z741 Need for assistance with personal care: Secondary | ICD-10-CM | POA: Diagnosis not present

## 2021-05-13 DIAGNOSIS — B3781 Candidal esophagitis: Secondary | ICD-10-CM | POA: Diagnosis not present

## 2021-05-13 DIAGNOSIS — K449 Diaphragmatic hernia without obstruction or gangrene: Secondary | ICD-10-CM | POA: Diagnosis not present

## 2021-05-13 DIAGNOSIS — K220 Achalasia of cardia: Secondary | ICD-10-CM | POA: Diagnosis not present

## 2021-05-13 DIAGNOSIS — I10 Essential (primary) hypertension: Secondary | ICD-10-CM | POA: Diagnosis not present

## 2021-05-13 DIAGNOSIS — F32A Depression, unspecified: Secondary | ICD-10-CM | POA: Diagnosis not present

## 2021-05-13 DIAGNOSIS — R1310 Dysphagia, unspecified: Secondary | ICD-10-CM | POA: Diagnosis not present

## 2021-05-13 DIAGNOSIS — R531 Weakness: Secondary | ICD-10-CM | POA: Diagnosis not present

## 2021-05-13 DIAGNOSIS — G629 Polyneuropathy, unspecified: Secondary | ICD-10-CM | POA: Diagnosis not present

## 2021-05-14 DIAGNOSIS — R2689 Other abnormalities of gait and mobility: Secondary | ICD-10-CM | POA: Diagnosis not present

## 2021-05-14 DIAGNOSIS — Z741 Need for assistance with personal care: Secondary | ICD-10-CM | POA: Diagnosis not present

## 2021-05-14 DIAGNOSIS — M6281 Muscle weakness (generalized): Secondary | ICD-10-CM | POA: Diagnosis not present

## 2021-05-20 DIAGNOSIS — K220 Achalasia of cardia: Secondary | ICD-10-CM | POA: Diagnosis not present

## 2021-05-20 DIAGNOSIS — M545 Low back pain, unspecified: Secondary | ICD-10-CM | POA: Diagnosis not present

## 2021-05-20 DIAGNOSIS — K591 Functional diarrhea: Secondary | ICD-10-CM | POA: Diagnosis not present

## 2021-05-20 DIAGNOSIS — G8929 Other chronic pain: Secondary | ICD-10-CM | POA: Diagnosis not present

## 2021-05-20 DIAGNOSIS — F411 Generalized anxiety disorder: Secondary | ICD-10-CM | POA: Diagnosis not present

## 2021-05-20 DIAGNOSIS — K449 Diaphragmatic hernia without obstruction or gangrene: Secondary | ICD-10-CM | POA: Diagnosis not present

## 2021-05-20 DIAGNOSIS — G629 Polyneuropathy, unspecified: Secondary | ICD-10-CM | POA: Diagnosis not present

## 2021-05-20 DIAGNOSIS — F32A Depression, unspecified: Secondary | ICD-10-CM | POA: Diagnosis not present

## 2021-05-20 DIAGNOSIS — K2970 Gastritis, unspecified, without bleeding: Secondary | ICD-10-CM | POA: Diagnosis not present

## 2021-05-21 DIAGNOSIS — Z6820 Body mass index (BMI) 20.0-20.9, adult: Secondary | ICD-10-CM | POA: Diagnosis not present

## 2021-05-21 DIAGNOSIS — Z7409 Other reduced mobility: Secondary | ICD-10-CM | POA: Diagnosis not present

## 2021-05-21 DIAGNOSIS — Z09 Encounter for follow-up examination after completed treatment for conditions other than malignant neoplasm: Secondary | ICD-10-CM | POA: Diagnosis not present

## 2021-05-21 DIAGNOSIS — K220 Achalasia of cardia: Secondary | ICD-10-CM | POA: Diagnosis not present

## 2021-05-21 DIAGNOSIS — M25551 Pain in right hip: Secondary | ICD-10-CM | POA: Diagnosis not present

## 2021-05-21 DIAGNOSIS — K449 Diaphragmatic hernia without obstruction or gangrene: Secondary | ICD-10-CM | POA: Diagnosis not present

## 2021-05-21 DIAGNOSIS — J9811 Atelectasis: Secondary | ICD-10-CM | POA: Diagnosis not present

## 2021-05-21 DIAGNOSIS — R072 Precordial pain: Secondary | ICD-10-CM | POA: Diagnosis not present

## 2021-05-23 DIAGNOSIS — K2970 Gastritis, unspecified, without bleeding: Secondary | ICD-10-CM | POA: Diagnosis not present

## 2021-05-23 DIAGNOSIS — F411 Generalized anxiety disorder: Secondary | ICD-10-CM | POA: Diagnosis not present

## 2021-05-23 DIAGNOSIS — K449 Diaphragmatic hernia without obstruction or gangrene: Secondary | ICD-10-CM | POA: Diagnosis not present

## 2021-05-23 DIAGNOSIS — G629 Polyneuropathy, unspecified: Secondary | ICD-10-CM | POA: Diagnosis not present

## 2021-05-23 DIAGNOSIS — M545 Low back pain, unspecified: Secondary | ICD-10-CM | POA: Diagnosis not present

## 2021-05-23 DIAGNOSIS — K591 Functional diarrhea: Secondary | ICD-10-CM | POA: Diagnosis not present

## 2021-05-23 DIAGNOSIS — K220 Achalasia of cardia: Secondary | ICD-10-CM | POA: Diagnosis not present

## 2021-05-23 DIAGNOSIS — F32A Depression, unspecified: Secondary | ICD-10-CM | POA: Diagnosis not present

## 2021-05-23 DIAGNOSIS — G8929 Other chronic pain: Secondary | ICD-10-CM | POA: Diagnosis not present

## 2021-05-24 DIAGNOSIS — M545 Low back pain, unspecified: Secondary | ICD-10-CM | POA: Diagnosis not present

## 2021-05-24 DIAGNOSIS — K2970 Gastritis, unspecified, without bleeding: Secondary | ICD-10-CM | POA: Diagnosis not present

## 2021-05-24 DIAGNOSIS — K591 Functional diarrhea: Secondary | ICD-10-CM | POA: Diagnosis not present

## 2021-05-24 DIAGNOSIS — F411 Generalized anxiety disorder: Secondary | ICD-10-CM | POA: Diagnosis not present

## 2021-05-24 DIAGNOSIS — F32A Depression, unspecified: Secondary | ICD-10-CM | POA: Diagnosis not present

## 2021-05-24 DIAGNOSIS — K449 Diaphragmatic hernia without obstruction or gangrene: Secondary | ICD-10-CM | POA: Diagnosis not present

## 2021-05-24 DIAGNOSIS — G629 Polyneuropathy, unspecified: Secondary | ICD-10-CM | POA: Diagnosis not present

## 2021-05-24 DIAGNOSIS — G8929 Other chronic pain: Secondary | ICD-10-CM | POA: Diagnosis not present

## 2021-05-24 DIAGNOSIS — K220 Achalasia of cardia: Secondary | ICD-10-CM | POA: Diagnosis not present

## 2021-05-26 DIAGNOSIS — K591 Functional diarrhea: Secondary | ICD-10-CM | POA: Diagnosis not present

## 2021-05-26 DIAGNOSIS — G629 Polyneuropathy, unspecified: Secondary | ICD-10-CM | POA: Diagnosis not present

## 2021-05-26 DIAGNOSIS — K220 Achalasia of cardia: Secondary | ICD-10-CM | POA: Diagnosis not present

## 2021-05-26 DIAGNOSIS — G8929 Other chronic pain: Secondary | ICD-10-CM | POA: Diagnosis not present

## 2021-05-26 DIAGNOSIS — F411 Generalized anxiety disorder: Secondary | ICD-10-CM | POA: Diagnosis not present

## 2021-05-26 DIAGNOSIS — M545 Low back pain, unspecified: Secondary | ICD-10-CM | POA: Diagnosis not present

## 2021-05-26 DIAGNOSIS — K449 Diaphragmatic hernia without obstruction or gangrene: Secondary | ICD-10-CM | POA: Diagnosis not present

## 2021-05-26 DIAGNOSIS — K2970 Gastritis, unspecified, without bleeding: Secondary | ICD-10-CM | POA: Diagnosis not present

## 2021-05-26 DIAGNOSIS — F32A Depression, unspecified: Secondary | ICD-10-CM | POA: Diagnosis not present

## 2021-05-28 DIAGNOSIS — K220 Achalasia of cardia: Secondary | ICD-10-CM | POA: Diagnosis not present

## 2021-05-29 DIAGNOSIS — K2970 Gastritis, unspecified, without bleeding: Secondary | ICD-10-CM | POA: Diagnosis not present

## 2021-05-29 DIAGNOSIS — M545 Low back pain, unspecified: Secondary | ICD-10-CM | POA: Diagnosis not present

## 2021-05-29 DIAGNOSIS — G8929 Other chronic pain: Secondary | ICD-10-CM | POA: Diagnosis not present

## 2021-05-29 DIAGNOSIS — F411 Generalized anxiety disorder: Secondary | ICD-10-CM | POA: Diagnosis not present

## 2021-05-29 DIAGNOSIS — F32A Depression, unspecified: Secondary | ICD-10-CM | POA: Diagnosis not present

## 2021-05-29 DIAGNOSIS — K449 Diaphragmatic hernia without obstruction or gangrene: Secondary | ICD-10-CM | POA: Diagnosis not present

## 2021-05-29 DIAGNOSIS — G629 Polyneuropathy, unspecified: Secondary | ICD-10-CM | POA: Diagnosis not present

## 2021-05-29 DIAGNOSIS — K220 Achalasia of cardia: Secondary | ICD-10-CM | POA: Diagnosis not present

## 2021-05-29 DIAGNOSIS — K591 Functional diarrhea: Secondary | ICD-10-CM | POA: Diagnosis not present

## 2021-05-30 DIAGNOSIS — G629 Polyneuropathy, unspecified: Secondary | ICD-10-CM | POA: Diagnosis not present

## 2021-05-30 DIAGNOSIS — K449 Diaphragmatic hernia without obstruction or gangrene: Secondary | ICD-10-CM | POA: Diagnosis not present

## 2021-05-30 DIAGNOSIS — K220 Achalasia of cardia: Secondary | ICD-10-CM | POA: Diagnosis not present

## 2021-05-30 DIAGNOSIS — K2970 Gastritis, unspecified, without bleeding: Secondary | ICD-10-CM | POA: Diagnosis not present

## 2021-05-30 DIAGNOSIS — F411 Generalized anxiety disorder: Secondary | ICD-10-CM | POA: Diagnosis not present

## 2021-05-30 DIAGNOSIS — K591 Functional diarrhea: Secondary | ICD-10-CM | POA: Diagnosis not present

## 2021-05-30 DIAGNOSIS — F32A Depression, unspecified: Secondary | ICD-10-CM | POA: Diagnosis not present

## 2021-05-30 DIAGNOSIS — G8929 Other chronic pain: Secondary | ICD-10-CM | POA: Diagnosis not present

## 2021-05-30 DIAGNOSIS — M545 Low back pain, unspecified: Secondary | ICD-10-CM | POA: Diagnosis not present

## 2021-06-02 DIAGNOSIS — G629 Polyneuropathy, unspecified: Secondary | ICD-10-CM | POA: Diagnosis not present

## 2021-06-02 DIAGNOSIS — F32A Depression, unspecified: Secondary | ICD-10-CM | POA: Diagnosis not present

## 2021-06-02 DIAGNOSIS — G8929 Other chronic pain: Secondary | ICD-10-CM | POA: Diagnosis not present

## 2021-06-02 DIAGNOSIS — K591 Functional diarrhea: Secondary | ICD-10-CM | POA: Diagnosis not present

## 2021-06-02 DIAGNOSIS — K2970 Gastritis, unspecified, without bleeding: Secondary | ICD-10-CM | POA: Diagnosis not present

## 2021-06-02 DIAGNOSIS — K220 Achalasia of cardia: Secondary | ICD-10-CM | POA: Diagnosis not present

## 2021-06-02 DIAGNOSIS — K449 Diaphragmatic hernia without obstruction or gangrene: Secondary | ICD-10-CM | POA: Diagnosis not present

## 2021-06-02 DIAGNOSIS — M545 Low back pain, unspecified: Secondary | ICD-10-CM | POA: Diagnosis not present

## 2021-06-02 DIAGNOSIS — F411 Generalized anxiety disorder: Secondary | ICD-10-CM | POA: Diagnosis not present

## 2021-06-03 DIAGNOSIS — S199XXA Unspecified injury of neck, initial encounter: Secondary | ICD-10-CM | POA: Diagnosis not present

## 2021-06-03 DIAGNOSIS — S0083XA Contusion of other part of head, initial encounter: Secondary | ICD-10-CM | POA: Diagnosis not present

## 2021-06-03 DIAGNOSIS — R5383 Other fatigue: Secondary | ICD-10-CM | POA: Diagnosis not present

## 2021-06-03 DIAGNOSIS — S0990XA Unspecified injury of head, initial encounter: Secondary | ICD-10-CM | POA: Diagnosis not present

## 2021-06-04 DIAGNOSIS — R9431 Abnormal electrocardiogram [ECG] [EKG]: Secondary | ICD-10-CM | POA: Diagnosis not present

## 2021-06-05 DIAGNOSIS — K449 Diaphragmatic hernia without obstruction or gangrene: Secondary | ICD-10-CM | POA: Diagnosis not present

## 2021-06-05 DIAGNOSIS — K209 Esophagitis, unspecified without bleeding: Secondary | ICD-10-CM | POA: Diagnosis not present

## 2021-06-05 DIAGNOSIS — K2289 Other specified disease of esophagus: Secondary | ICD-10-CM | POA: Diagnosis not present

## 2021-06-10 DIAGNOSIS — R5383 Other fatigue: Secondary | ICD-10-CM | POA: Diagnosis not present

## 2021-06-16 DIAGNOSIS — R262 Difficulty in walking, not elsewhere classified: Secondary | ICD-10-CM | POA: Diagnosis not present

## 2021-06-17 DIAGNOSIS — R262 Difficulty in walking, not elsewhere classified: Secondary | ICD-10-CM | POA: Diagnosis not present

## 2021-06-18 DIAGNOSIS — R262 Difficulty in walking, not elsewhere classified: Secondary | ICD-10-CM | POA: Diagnosis not present

## 2021-06-19 DIAGNOSIS — R262 Difficulty in walking, not elsewhere classified: Secondary | ICD-10-CM | POA: Diagnosis not present

## 2021-06-20 DIAGNOSIS — R262 Difficulty in walking, not elsewhere classified: Secondary | ICD-10-CM | POA: Diagnosis not present

## 2021-06-23 DIAGNOSIS — M2042 Other hammer toe(s) (acquired), left foot: Secondary | ICD-10-CM | POA: Diagnosis not present

## 2021-06-23 DIAGNOSIS — R262 Difficulty in walking, not elsewhere classified: Secondary | ICD-10-CM | POA: Diagnosis not present

## 2021-06-23 DIAGNOSIS — L84 Corns and callosities: Secondary | ICD-10-CM | POA: Diagnosis not present

## 2021-06-23 DIAGNOSIS — I70203 Unspecified atherosclerosis of native arteries of extremities, bilateral legs: Secondary | ICD-10-CM | POA: Diagnosis not present

## 2021-06-24 DIAGNOSIS — R262 Difficulty in walking, not elsewhere classified: Secondary | ICD-10-CM | POA: Diagnosis not present

## 2021-06-27 DIAGNOSIS — I10 Essential (primary) hypertension: Secondary | ICD-10-CM | POA: Diagnosis not present

## 2021-06-27 DIAGNOSIS — Z6821 Body mass index (BMI) 21.0-21.9, adult: Secondary | ICD-10-CM | POA: Diagnosis not present

## 2021-06-27 DIAGNOSIS — Z23 Encounter for immunization: Secondary | ICD-10-CM | POA: Diagnosis not present

## 2021-06-27 DIAGNOSIS — E7800 Pure hypercholesterolemia, unspecified: Secondary | ICD-10-CM | POA: Diagnosis not present

## 2021-06-30 DIAGNOSIS — M6281 Muscle weakness (generalized): Secondary | ICD-10-CM | POA: Diagnosis not present

## 2021-06-30 DIAGNOSIS — M5451 Vertebrogenic low back pain: Secondary | ICD-10-CM | POA: Diagnosis not present

## 2021-06-30 DIAGNOSIS — R2689 Other abnormalities of gait and mobility: Secondary | ICD-10-CM | POA: Diagnosis not present

## 2021-07-03 DIAGNOSIS — M5451 Vertebrogenic low back pain: Secondary | ICD-10-CM | POA: Diagnosis not present

## 2021-07-03 DIAGNOSIS — M6281 Muscle weakness (generalized): Secondary | ICD-10-CM | POA: Diagnosis not present

## 2021-07-03 DIAGNOSIS — R2689 Other abnormalities of gait and mobility: Secondary | ICD-10-CM | POA: Diagnosis not present

## 2021-07-08 DIAGNOSIS — R2689 Other abnormalities of gait and mobility: Secondary | ICD-10-CM | POA: Diagnosis not present

## 2021-07-08 DIAGNOSIS — M5451 Vertebrogenic low back pain: Secondary | ICD-10-CM | POA: Diagnosis not present

## 2021-07-08 DIAGNOSIS — M6281 Muscle weakness (generalized): Secondary | ICD-10-CM | POA: Diagnosis not present

## 2021-07-10 DIAGNOSIS — M5451 Vertebrogenic low back pain: Secondary | ICD-10-CM | POA: Diagnosis not present

## 2021-07-10 DIAGNOSIS — M6281 Muscle weakness (generalized): Secondary | ICD-10-CM | POA: Diagnosis not present

## 2021-07-10 DIAGNOSIS — R2689 Other abnormalities of gait and mobility: Secondary | ICD-10-CM | POA: Diagnosis not present

## 2021-07-14 DIAGNOSIS — M6281 Muscle weakness (generalized): Secondary | ICD-10-CM | POA: Diagnosis not present

## 2021-07-14 DIAGNOSIS — R2689 Other abnormalities of gait and mobility: Secondary | ICD-10-CM | POA: Diagnosis not present

## 2021-07-14 DIAGNOSIS — M5451 Vertebrogenic low back pain: Secondary | ICD-10-CM | POA: Diagnosis not present

## 2021-07-29 DIAGNOSIS — R5383 Other fatigue: Secondary | ICD-10-CM | POA: Diagnosis not present

## 2021-07-29 DIAGNOSIS — E785 Hyperlipidemia, unspecified: Secondary | ICD-10-CM | POA: Diagnosis not present

## 2021-07-29 DIAGNOSIS — R531 Weakness: Secondary | ICD-10-CM | POA: Diagnosis not present

## 2021-07-29 DIAGNOSIS — R627 Adult failure to thrive: Secondary | ICD-10-CM | POA: Diagnosis not present

## 2021-07-29 DIAGNOSIS — Z1152 Encounter for screening for COVID-19: Secondary | ICD-10-CM | POA: Diagnosis not present

## 2021-07-29 DIAGNOSIS — I10 Essential (primary) hypertension: Secondary | ICD-10-CM | POA: Diagnosis not present

## 2021-07-29 DIAGNOSIS — Z79899 Other long term (current) drug therapy: Secondary | ICD-10-CM | POA: Diagnosis not present

## 2021-07-30 DIAGNOSIS — R531 Weakness: Secondary | ICD-10-CM | POA: Diagnosis not present

## 2021-07-30 DIAGNOSIS — R627 Adult failure to thrive: Secondary | ICD-10-CM | POA: Diagnosis not present

## 2021-07-30 DIAGNOSIS — I10 Essential (primary) hypertension: Secondary | ICD-10-CM | POA: Diagnosis not present

## 2021-07-30 DIAGNOSIS — R42 Dizziness and giddiness: Secondary | ICD-10-CM | POA: Diagnosis not present

## 2021-07-31 DIAGNOSIS — R531 Weakness: Secondary | ICD-10-CM | POA: Diagnosis not present

## 2021-07-31 DIAGNOSIS — I10 Essential (primary) hypertension: Secondary | ICD-10-CM | POA: Diagnosis not present

## 2021-07-31 DIAGNOSIS — R627 Adult failure to thrive: Secondary | ICD-10-CM | POA: Diagnosis not present

## 2021-08-01 DIAGNOSIS — I10 Essential (primary) hypertension: Secondary | ICD-10-CM | POA: Diagnosis not present

## 2021-08-01 DIAGNOSIS — K449 Diaphragmatic hernia without obstruction or gangrene: Secondary | ICD-10-CM | POA: Diagnosis not present

## 2021-08-01 DIAGNOSIS — E860 Dehydration: Secondary | ICD-10-CM | POA: Diagnosis not present

## 2021-08-01 DIAGNOSIS — Z79899 Other long term (current) drug therapy: Secondary | ICD-10-CM | POA: Diagnosis not present

## 2021-08-01 DIAGNOSIS — R531 Weakness: Secondary | ICD-10-CM | POA: Diagnosis not present

## 2021-08-01 DIAGNOSIS — R627 Adult failure to thrive: Secondary | ICD-10-CM | POA: Diagnosis not present

## 2021-08-01 DIAGNOSIS — Z5941 Food insecurity: Secondary | ICD-10-CM | POA: Diagnosis not present

## 2021-08-01 DIAGNOSIS — E785 Hyperlipidemia, unspecified: Secondary | ICD-10-CM | POA: Diagnosis not present

## 2021-08-01 DIAGNOSIS — R1310 Dysphagia, unspecified: Secondary | ICD-10-CM | POA: Diagnosis not present

## 2021-08-01 DIAGNOSIS — Z741 Need for assistance with personal care: Secondary | ICD-10-CM | POA: Diagnosis not present

## 2021-08-01 DIAGNOSIS — G629 Polyneuropathy, unspecified: Secondary | ICD-10-CM | POA: Diagnosis not present

## 2021-08-01 DIAGNOSIS — Z681 Body mass index (BMI) 19 or less, adult: Secondary | ICD-10-CM | POA: Diagnosis not present

## 2021-08-01 DIAGNOSIS — F32A Depression, unspecified: Secondary | ICD-10-CM | POA: Diagnosis not present

## 2021-08-01 DIAGNOSIS — K2970 Gastritis, unspecified, without bleeding: Secondary | ICD-10-CM | POA: Diagnosis not present

## 2021-08-01 DIAGNOSIS — Z59819 Housing instability, housed unspecified: Secondary | ICD-10-CM | POA: Diagnosis not present

## 2021-08-01 DIAGNOSIS — R079 Chest pain, unspecified: Secondary | ICD-10-CM | POA: Diagnosis not present

## 2021-08-01 DIAGNOSIS — K591 Functional diarrhea: Secondary | ICD-10-CM | POA: Diagnosis not present

## 2021-08-01 DIAGNOSIS — K209 Esophagitis, unspecified without bleeding: Secondary | ICD-10-CM | POA: Diagnosis not present

## 2021-08-01 DIAGNOSIS — Q395 Congenital dilatation of esophagus: Secondary | ICD-10-CM | POA: Diagnosis not present

## 2021-08-01 DIAGNOSIS — K220 Achalasia of cardia: Secondary | ICD-10-CM | POA: Diagnosis not present

## 2021-08-01 DIAGNOSIS — N390 Urinary tract infection, site not specified: Secondary | ICD-10-CM | POA: Diagnosis not present

## 2021-08-01 DIAGNOSIS — K219 Gastro-esophageal reflux disease without esophagitis: Secondary | ICD-10-CM | POA: Diagnosis not present

## 2021-08-01 DIAGNOSIS — R5383 Other fatigue: Secondary | ICD-10-CM | POA: Diagnosis not present

## 2021-08-01 DIAGNOSIS — R2689 Other abnormalities of gait and mobility: Secondary | ICD-10-CM | POA: Diagnosis not present

## 2021-08-01 DIAGNOSIS — Z1152 Encounter for screening for COVID-19: Secondary | ICD-10-CM | POA: Diagnosis not present

## 2021-08-01 DIAGNOSIS — Z638 Other specified problems related to primary support group: Secondary | ICD-10-CM | POA: Diagnosis not present

## 2021-08-01 DIAGNOSIS — Z6820 Body mass index (BMI) 20.0-20.9, adult: Secondary | ICD-10-CM | POA: Diagnosis not present

## 2021-08-01 DIAGNOSIS — M6281 Muscle weakness (generalized): Secondary | ICD-10-CM | POA: Diagnosis not present

## 2021-08-01 DIAGNOSIS — Z608 Other problems related to social environment: Secondary | ICD-10-CM | POA: Diagnosis not present

## 2021-08-01 DIAGNOSIS — Z599 Problem related to housing and economic circumstances, unspecified: Secondary | ICD-10-CM | POA: Diagnosis not present

## 2021-08-04 DIAGNOSIS — K209 Esophagitis, unspecified without bleeding: Secondary | ICD-10-CM | POA: Diagnosis not present

## 2021-08-04 DIAGNOSIS — I10 Essential (primary) hypertension: Secondary | ICD-10-CM | POA: Diagnosis not present

## 2021-08-04 DIAGNOSIS — M6281 Muscle weakness (generalized): Secondary | ICD-10-CM | POA: Diagnosis not present

## 2021-08-04 DIAGNOSIS — R531 Weakness: Secondary | ICD-10-CM | POA: Diagnosis not present

## 2021-08-04 DIAGNOSIS — K449 Diaphragmatic hernia without obstruction or gangrene: Secondary | ICD-10-CM | POA: Diagnosis not present

## 2021-08-04 DIAGNOSIS — R627 Adult failure to thrive: Secondary | ICD-10-CM | POA: Diagnosis not present

## 2021-08-04 DIAGNOSIS — G629 Polyneuropathy, unspecified: Secondary | ICD-10-CM | POA: Diagnosis not present

## 2021-08-04 DIAGNOSIS — Z741 Need for assistance with personal care: Secondary | ICD-10-CM | POA: Diagnosis not present

## 2021-08-04 DIAGNOSIS — R2689 Other abnormalities of gait and mobility: Secondary | ICD-10-CM | POA: Diagnosis not present

## 2021-08-06 DIAGNOSIS — R2689 Other abnormalities of gait and mobility: Secondary | ICD-10-CM | POA: Diagnosis not present

## 2021-08-06 DIAGNOSIS — Z741 Need for assistance with personal care: Secondary | ICD-10-CM | POA: Diagnosis not present

## 2021-08-06 DIAGNOSIS — R627 Adult failure to thrive: Secondary | ICD-10-CM | POA: Diagnosis not present

## 2021-08-06 DIAGNOSIS — M6281 Muscle weakness (generalized): Secondary | ICD-10-CM | POA: Diagnosis not present

## 2021-08-07 DIAGNOSIS — G629 Polyneuropathy, unspecified: Secondary | ICD-10-CM | POA: Diagnosis not present

## 2021-08-07 DIAGNOSIS — R531 Weakness: Secondary | ICD-10-CM | POA: Diagnosis not present

## 2021-08-07 DIAGNOSIS — K219 Gastro-esophageal reflux disease without esophagitis: Secondary | ICD-10-CM | POA: Diagnosis not present

## 2021-08-07 DIAGNOSIS — I10 Essential (primary) hypertension: Secondary | ICD-10-CM | POA: Diagnosis not present

## 2021-08-11 DIAGNOSIS — Z638 Other specified problems related to primary support group: Secondary | ICD-10-CM | POA: Diagnosis not present

## 2021-08-11 DIAGNOSIS — Z608 Other problems related to social environment: Secondary | ICD-10-CM | POA: Diagnosis not present

## 2021-08-11 DIAGNOSIS — Z5941 Food insecurity: Secondary | ICD-10-CM | POA: Diagnosis not present

## 2021-08-11 DIAGNOSIS — R2689 Other abnormalities of gait and mobility: Secondary | ICD-10-CM | POA: Diagnosis not present

## 2021-08-11 DIAGNOSIS — Z59819 Housing instability, housed unspecified: Secondary | ICD-10-CM | POA: Diagnosis not present

## 2021-08-11 DIAGNOSIS — Z6820 Body mass index (BMI) 20.0-20.9, adult: Secondary | ICD-10-CM | POA: Diagnosis not present

## 2021-08-11 DIAGNOSIS — Q395 Congenital dilatation of esophagus: Secondary | ICD-10-CM | POA: Diagnosis not present

## 2021-08-11 DIAGNOSIS — K220 Achalasia of cardia: Secondary | ICD-10-CM | POA: Diagnosis not present

## 2021-08-11 DIAGNOSIS — M6281 Muscle weakness (generalized): Secondary | ICD-10-CM | POA: Diagnosis not present

## 2021-08-11 DIAGNOSIS — R627 Adult failure to thrive: Secondary | ICD-10-CM | POA: Diagnosis not present

## 2021-08-11 DIAGNOSIS — Z741 Need for assistance with personal care: Secondary | ICD-10-CM | POA: Diagnosis not present

## 2021-08-11 DIAGNOSIS — Z599 Problem related to housing and economic circumstances, unspecified: Secondary | ICD-10-CM | POA: Diagnosis not present

## 2021-08-12 DIAGNOSIS — K220 Achalasia of cardia: Secondary | ICD-10-CM | POA: Diagnosis not present

## 2021-08-12 DIAGNOSIS — Z681 Body mass index (BMI) 19 or less, adult: Secondary | ICD-10-CM | POA: Diagnosis not present

## 2021-08-13 DIAGNOSIS — R2689 Other abnormalities of gait and mobility: Secondary | ICD-10-CM | POA: Diagnosis not present

## 2021-08-13 DIAGNOSIS — M6281 Muscle weakness (generalized): Secondary | ICD-10-CM | POA: Diagnosis not present

## 2021-08-13 DIAGNOSIS — Z741 Need for assistance with personal care: Secondary | ICD-10-CM | POA: Diagnosis not present

## 2021-08-13 DIAGNOSIS — R627 Adult failure to thrive: Secondary | ICD-10-CM | POA: Diagnosis not present

## 2021-08-14 DIAGNOSIS — G629 Polyneuropathy, unspecified: Secondary | ICD-10-CM | POA: Diagnosis not present

## 2021-08-14 DIAGNOSIS — N390 Urinary tract infection, site not specified: Secondary | ICD-10-CM | POA: Diagnosis not present

## 2021-08-14 DIAGNOSIS — I10 Essential (primary) hypertension: Secondary | ICD-10-CM | POA: Diagnosis not present

## 2021-08-14 DIAGNOSIS — K209 Esophagitis, unspecified without bleeding: Secondary | ICD-10-CM | POA: Diagnosis not present

## 2021-08-14 DIAGNOSIS — R627 Adult failure to thrive: Secondary | ICD-10-CM | POA: Diagnosis not present

## 2021-08-14 DIAGNOSIS — K449 Diaphragmatic hernia without obstruction or gangrene: Secondary | ICD-10-CM | POA: Diagnosis not present

## 2021-08-14 DIAGNOSIS — F32A Depression, unspecified: Secondary | ICD-10-CM | POA: Diagnosis not present

## 2021-08-14 DIAGNOSIS — K219 Gastro-esophageal reflux disease without esophagitis: Secondary | ICD-10-CM | POA: Diagnosis not present

## 2021-08-18 DIAGNOSIS — R1311 Dysphagia, oral phase: Secondary | ICD-10-CM | POA: Diagnosis not present

## 2021-08-18 DIAGNOSIS — R1313 Dysphagia, pharyngeal phase: Secondary | ICD-10-CM | POA: Diagnosis not present

## 2021-08-18 DIAGNOSIS — R531 Weakness: Secondary | ICD-10-CM | POA: Diagnosis not present

## 2021-08-18 DIAGNOSIS — R262 Difficulty in walking, not elsewhere classified: Secondary | ICD-10-CM | POA: Diagnosis not present

## 2021-08-18 DIAGNOSIS — Z9181 History of falling: Secondary | ICD-10-CM | POA: Diagnosis not present

## 2021-08-20 DIAGNOSIS — R1311 Dysphagia, oral phase: Secondary | ICD-10-CM | POA: Diagnosis not present

## 2021-08-20 DIAGNOSIS — R1313 Dysphagia, pharyngeal phase: Secondary | ICD-10-CM | POA: Diagnosis not present

## 2021-08-20 DIAGNOSIS — Z9181 History of falling: Secondary | ICD-10-CM | POA: Diagnosis not present

## 2021-08-20 DIAGNOSIS — R262 Difficulty in walking, not elsewhere classified: Secondary | ICD-10-CM | POA: Diagnosis not present

## 2021-08-20 DIAGNOSIS — R531 Weakness: Secondary | ICD-10-CM | POA: Diagnosis not present

## 2021-08-21 DIAGNOSIS — R1311 Dysphagia, oral phase: Secondary | ICD-10-CM | POA: Diagnosis not present

## 2021-08-21 DIAGNOSIS — R1313 Dysphagia, pharyngeal phase: Secondary | ICD-10-CM | POA: Diagnosis not present

## 2021-08-21 DIAGNOSIS — Z9181 History of falling: Secondary | ICD-10-CM | POA: Diagnosis not present

## 2021-08-21 DIAGNOSIS — R262 Difficulty in walking, not elsewhere classified: Secondary | ICD-10-CM | POA: Diagnosis not present

## 2021-08-21 DIAGNOSIS — R531 Weakness: Secondary | ICD-10-CM | POA: Diagnosis not present

## 2021-08-22 DIAGNOSIS — R531 Weakness: Secondary | ICD-10-CM | POA: Diagnosis not present

## 2021-08-22 DIAGNOSIS — Z9181 History of falling: Secondary | ICD-10-CM | POA: Diagnosis not present

## 2021-08-22 DIAGNOSIS — R262 Difficulty in walking, not elsewhere classified: Secondary | ICD-10-CM | POA: Diagnosis not present

## 2021-08-22 DIAGNOSIS — R1313 Dysphagia, pharyngeal phase: Secondary | ICD-10-CM | POA: Diagnosis not present

## 2021-08-22 DIAGNOSIS — R1311 Dysphagia, oral phase: Secondary | ICD-10-CM | POA: Diagnosis not present

## 2021-08-26 DIAGNOSIS — R1313 Dysphagia, pharyngeal phase: Secondary | ICD-10-CM | POA: Diagnosis not present

## 2021-08-26 DIAGNOSIS — R262 Difficulty in walking, not elsewhere classified: Secondary | ICD-10-CM | POA: Diagnosis not present

## 2021-08-26 DIAGNOSIS — R531 Weakness: Secondary | ICD-10-CM | POA: Diagnosis not present

## 2021-08-26 DIAGNOSIS — R1311 Dysphagia, oral phase: Secondary | ICD-10-CM | POA: Diagnosis not present

## 2021-08-26 DIAGNOSIS — Z9181 History of falling: Secondary | ICD-10-CM | POA: Diagnosis not present

## 2021-08-27 DIAGNOSIS — R1313 Dysphagia, pharyngeal phase: Secondary | ICD-10-CM | POA: Diagnosis not present

## 2021-08-27 DIAGNOSIS — R1311 Dysphagia, oral phase: Secondary | ICD-10-CM | POA: Diagnosis not present

## 2021-08-27 DIAGNOSIS — R262 Difficulty in walking, not elsewhere classified: Secondary | ICD-10-CM | POA: Diagnosis not present

## 2021-08-27 DIAGNOSIS — Z9181 History of falling: Secondary | ICD-10-CM | POA: Diagnosis not present

## 2021-08-27 DIAGNOSIS — R531 Weakness: Secondary | ICD-10-CM | POA: Diagnosis not present

## 2021-08-29 DIAGNOSIS — R1313 Dysphagia, pharyngeal phase: Secondary | ICD-10-CM | POA: Diagnosis not present

## 2021-08-29 DIAGNOSIS — R531 Weakness: Secondary | ICD-10-CM | POA: Diagnosis not present

## 2021-08-29 DIAGNOSIS — N390 Urinary tract infection, site not specified: Secondary | ICD-10-CM | POA: Diagnosis not present

## 2021-08-29 DIAGNOSIS — R1311 Dysphagia, oral phase: Secondary | ICD-10-CM | POA: Diagnosis not present

## 2021-08-29 DIAGNOSIS — R262 Difficulty in walking, not elsewhere classified: Secondary | ICD-10-CM | POA: Diagnosis not present

## 2021-08-29 DIAGNOSIS — Z9181 History of falling: Secondary | ICD-10-CM | POA: Diagnosis not present

## 2021-10-31 DIAGNOSIS — W19XXXD Unspecified fall, subsequent encounter: Secondary | ICD-10-CM | POA: Diagnosis not present

## 2021-10-31 DIAGNOSIS — I959 Hypotension, unspecified: Secondary | ICD-10-CM | POA: Diagnosis not present

## 2021-10-31 DIAGNOSIS — R1310 Dysphagia, unspecified: Secondary | ICD-10-CM | POA: Diagnosis not present

## 2021-11-04 DIAGNOSIS — I959 Hypotension, unspecified: Secondary | ICD-10-CM | POA: Diagnosis not present

## 2021-11-04 DIAGNOSIS — W19XXXD Unspecified fall, subsequent encounter: Secondary | ICD-10-CM | POA: Diagnosis not present

## 2021-11-04 DIAGNOSIS — R1310 Dysphagia, unspecified: Secondary | ICD-10-CM | POA: Diagnosis not present

## 2021-11-05 DIAGNOSIS — I959 Hypotension, unspecified: Secondary | ICD-10-CM | POA: Diagnosis not present

## 2021-11-05 DIAGNOSIS — W19XXXD Unspecified fall, subsequent encounter: Secondary | ICD-10-CM | POA: Diagnosis not present

## 2021-11-05 DIAGNOSIS — R1310 Dysphagia, unspecified: Secondary | ICD-10-CM | POA: Diagnosis not present

## 2021-11-06 DIAGNOSIS — R1310 Dysphagia, unspecified: Secondary | ICD-10-CM | POA: Diagnosis not present

## 2021-11-06 DIAGNOSIS — W19XXXD Unspecified fall, subsequent encounter: Secondary | ICD-10-CM | POA: Diagnosis not present

## 2021-11-06 DIAGNOSIS — I959 Hypotension, unspecified: Secondary | ICD-10-CM | POA: Diagnosis not present

## 2021-11-10 DIAGNOSIS — W19XXXD Unspecified fall, subsequent encounter: Secondary | ICD-10-CM | POA: Diagnosis not present

## 2021-11-10 DIAGNOSIS — I959 Hypotension, unspecified: Secondary | ICD-10-CM | POA: Diagnosis not present

## 2021-11-10 DIAGNOSIS — R1310 Dysphagia, unspecified: Secondary | ICD-10-CM | POA: Diagnosis not present

## 2021-11-11 DIAGNOSIS — W19XXXD Unspecified fall, subsequent encounter: Secondary | ICD-10-CM | POA: Diagnosis not present

## 2021-11-11 DIAGNOSIS — R1310 Dysphagia, unspecified: Secondary | ICD-10-CM | POA: Diagnosis not present

## 2021-11-11 DIAGNOSIS — I959 Hypotension, unspecified: Secondary | ICD-10-CM | POA: Diagnosis not present

## 2021-11-12 DIAGNOSIS — W19XXXD Unspecified fall, subsequent encounter: Secondary | ICD-10-CM | POA: Diagnosis not present

## 2021-11-12 DIAGNOSIS — I959 Hypotension, unspecified: Secondary | ICD-10-CM | POA: Diagnosis not present

## 2021-11-12 DIAGNOSIS — R1310 Dysphagia, unspecified: Secondary | ICD-10-CM | POA: Diagnosis not present

## 2021-11-14 DIAGNOSIS — R1310 Dysphagia, unspecified: Secondary | ICD-10-CM | POA: Diagnosis not present

## 2021-11-14 DIAGNOSIS — W19XXXD Unspecified fall, subsequent encounter: Secondary | ICD-10-CM | POA: Diagnosis not present

## 2021-11-14 DIAGNOSIS — I959 Hypotension, unspecified: Secondary | ICD-10-CM | POA: Diagnosis not present

## 2021-11-18 DIAGNOSIS — W19XXXD Unspecified fall, subsequent encounter: Secondary | ICD-10-CM | POA: Diagnosis not present

## 2021-11-18 DIAGNOSIS — R1310 Dysphagia, unspecified: Secondary | ICD-10-CM | POA: Diagnosis not present

## 2021-11-18 DIAGNOSIS — I959 Hypotension, unspecified: Secondary | ICD-10-CM | POA: Diagnosis not present

## 2021-11-19 DIAGNOSIS — I959 Hypotension, unspecified: Secondary | ICD-10-CM | POA: Diagnosis not present

## 2021-11-19 DIAGNOSIS — W19XXXD Unspecified fall, subsequent encounter: Secondary | ICD-10-CM | POA: Diagnosis not present

## 2021-11-19 DIAGNOSIS — R1310 Dysphagia, unspecified: Secondary | ICD-10-CM | POA: Diagnosis not present

## 2021-11-20 DIAGNOSIS — I959 Hypotension, unspecified: Secondary | ICD-10-CM | POA: Diagnosis not present

## 2021-11-20 DIAGNOSIS — R1310 Dysphagia, unspecified: Secondary | ICD-10-CM | POA: Diagnosis not present

## 2021-11-20 DIAGNOSIS — W19XXXD Unspecified fall, subsequent encounter: Secondary | ICD-10-CM | POA: Diagnosis not present

## 2021-11-21 DIAGNOSIS — R1310 Dysphagia, unspecified: Secondary | ICD-10-CM | POA: Diagnosis not present

## 2021-11-21 DIAGNOSIS — I959 Hypotension, unspecified: Secondary | ICD-10-CM | POA: Diagnosis not present

## 2021-11-21 DIAGNOSIS — W19XXXD Unspecified fall, subsequent encounter: Secondary | ICD-10-CM | POA: Diagnosis not present

## 2021-11-23 DIAGNOSIS — R1310 Dysphagia, unspecified: Secondary | ICD-10-CM | POA: Diagnosis not present

## 2021-11-23 DIAGNOSIS — W19XXXD Unspecified fall, subsequent encounter: Secondary | ICD-10-CM | POA: Diagnosis not present

## 2021-11-23 DIAGNOSIS — I959 Hypotension, unspecified: Secondary | ICD-10-CM | POA: Diagnosis not present

## 2021-11-24 DIAGNOSIS — R1310 Dysphagia, unspecified: Secondary | ICD-10-CM | POA: Diagnosis not present

## 2021-11-24 DIAGNOSIS — I959 Hypotension, unspecified: Secondary | ICD-10-CM | POA: Diagnosis not present

## 2021-11-24 DIAGNOSIS — W19XXXD Unspecified fall, subsequent encounter: Secondary | ICD-10-CM | POA: Diagnosis not present

## 2021-11-26 DIAGNOSIS — R1310 Dysphagia, unspecified: Secondary | ICD-10-CM | POA: Diagnosis not present

## 2021-11-26 DIAGNOSIS — W19XXXD Unspecified fall, subsequent encounter: Secondary | ICD-10-CM | POA: Diagnosis not present

## 2021-11-26 DIAGNOSIS — I959 Hypotension, unspecified: Secondary | ICD-10-CM | POA: Diagnosis not present

## 2021-11-27 DIAGNOSIS — W19XXXD Unspecified fall, subsequent encounter: Secondary | ICD-10-CM | POA: Diagnosis not present

## 2021-11-27 DIAGNOSIS — R1310 Dysphagia, unspecified: Secondary | ICD-10-CM | POA: Diagnosis not present

## 2021-11-27 DIAGNOSIS — I959 Hypotension, unspecified: Secondary | ICD-10-CM | POA: Diagnosis not present

## 2021-11-28 DIAGNOSIS — R1310 Dysphagia, unspecified: Secondary | ICD-10-CM | POA: Diagnosis not present

## 2021-11-28 DIAGNOSIS — I959 Hypotension, unspecified: Secondary | ICD-10-CM | POA: Diagnosis not present

## 2021-11-28 DIAGNOSIS — W19XXXD Unspecified fall, subsequent encounter: Secondary | ICD-10-CM | POA: Diagnosis not present
